# Patient Record
Sex: Female | Born: 1935 | Race: White | Hispanic: No | Marital: Single | Smoking: Never smoker
Health system: Southern US, Community
[De-identification: ages and names within clinical notes are randomized; demographics above are authoritative.]

## PROBLEM LIST (undated history)

## (undated) DIAGNOSIS — H9190 Unspecified hearing loss, unspecified ear: Secondary | ICD-10-CM

## (undated) DIAGNOSIS — E119 Type 2 diabetes mellitus without complications: Secondary | ICD-10-CM

## (undated) DIAGNOSIS — H269 Unspecified cataract: Secondary | ICD-10-CM

## (undated) HISTORY — PX: CATARACT EXTRACTION: SUR2

## (undated) HISTORY — PX: TONSILLECTOMY: SUR1361

## (undated) HISTORY — PX: COCHLEAR IMPLANT: SUR684

---

## 2013-01-23 ENCOUNTER — Emergency Department (HOSPITAL_BASED_OUTPATIENT_CLINIC_OR_DEPARTMENT_OTHER)
Admission: EM | Admit: 2013-01-23 | Discharge: 2013-01-23 | Disposition: A | Discharge status: Home / Self Care | Payer: Medicare Other | Attending: Emergency Medicine | Admitting: Emergency Medicine

## 2013-01-23 ENCOUNTER — Encounter (HOSPITAL_BASED_OUTPATIENT_CLINIC_OR_DEPARTMENT_OTHER): Payer: Self-pay | Admitting: *Deleted

## 2013-01-23 DIAGNOSIS — Z8669 Personal history of other diseases of the nervous system and sense organs: Secondary | ICD-10-CM | POA: Insufficient documentation

## 2013-01-23 DIAGNOSIS — E119 Type 2 diabetes mellitus without complications: Secondary | ICD-10-CM | POA: Insufficient documentation

## 2013-01-23 DIAGNOSIS — Z8639 Personal history of other endocrine, nutritional and metabolic disease: Secondary | ICD-10-CM | POA: Insufficient documentation

## 2013-01-23 DIAGNOSIS — Z862 Personal history of diseases of the blood and blood-forming organs and certain disorders involving the immune mechanism: Secondary | ICD-10-CM | POA: Insufficient documentation

## 2013-01-23 DIAGNOSIS — Y9289 Other specified places as the place of occurrence of the external cause: Secondary | ICD-10-CM | POA: Insufficient documentation

## 2013-01-23 DIAGNOSIS — S90822A Blister (nonthermal), left foot, initial encounter: Secondary | ICD-10-CM

## 2013-01-23 DIAGNOSIS — IMO0002 Reserved for concepts with insufficient information to code with codable children: Secondary | ICD-10-CM | POA: Insufficient documentation

## 2013-01-23 DIAGNOSIS — Y939 Activity, unspecified: Secondary | ICD-10-CM | POA: Insufficient documentation

## 2013-01-23 DIAGNOSIS — Z79899 Other long term (current) drug therapy: Secondary | ICD-10-CM | POA: Insufficient documentation

## 2013-01-23 DIAGNOSIS — Z9849 Cataract extraction status, unspecified eye: Secondary | ICD-10-CM | POA: Insufficient documentation

## 2013-01-23 HISTORY — DX: Unspecified hearing loss, unspecified ear: H91.90

## 2013-01-23 HISTORY — DX: Type 2 diabetes mellitus without complications: E11.9

## 2013-01-23 HISTORY — DX: Hemochromatosis, unspecified: E83.119

## 2013-01-23 HISTORY — DX: Unspecified cataract: H26.9

## 2013-01-23 NOTE — ED Provider Notes (Signed)
History    CSN: [id number] Arrival date & time 01/23/13  1140  First MD Initiated Contact with Patient 01/23/13 1211     Chief Complaint  Patient presents with  . Blister   (Consider location/radiation/quality/duration/timing/severity/associated sxs/prior Treatment) Patient is a 79 y.o. female presenting with foot injury.  Foot Injury Location:  Foot Foot location:  L foot Pain details:    Quality:  Aching   Severity:  Moderate   Onset quality:  Sudden   Timing:  Constant   Progression:  Worsening Chronicity:  New Foreign body present:  No foreign bodies Tetanus status:  Up to date Pt reports she was bitten on left foot by something on Sunday.  Pt reports a big blister came up.  Pt reports getting worse,  Was clear now discolored, red around.  Pt wants blister drined Past Medical History  Diagnosis Date  . Diabetes mellitus without complication   . Hemochromatosis   . Cataract of both eyes   . Deaf    Past Surgical History  Procedure Laterality Date  . Tonsillectomy    . Cataract extraction    . Cochlear implant     No family history on file. History  Substance Use Topics  . Smoking status: Never Smoker   . Smokeless tobacco: Not on file  . Alcohol Use: No   OB History   Grav Para Term Preterm Abortions TAB SAB Ect Mult Living                 Review of Systems  Skin: Positive for wound.  All other systems reviewed and are negative.    Allergies  Review of patient's allergies indicates no known allergies.  Home Medications   Current Outpatient Rx  Name  Route  Sig  Dispense  Refill  . doxycycline (DORYX) 100 MG DR capsule   Oral   Take 100 mg by mouth 2 (two) times daily.         Marland Kitchen METFORMIN HCL PO   Oral   Take by mouth.         . Travoprost (TRAVATAN OP)   Ophthalmic   Apply to eye.          BP 139/84  Pulse 79  Temp(Src) 98.7 F (37.1 C) (Oral)  Resp 20  Wt 103 lb (46.72 kg)  SpO2 100% Physical Exam  Nursing note and  vitals reviewed. Constitutional: She is oriented to person, place, and time. She appears well-developed and well-nourished.  Musculoskeletal: She exhibits tenderness.  6cm blister erythema edges, yellow fluid,    Neurological: She is alert and oriented to person, place, and time. She has normal reflexes.  Skin: Skin is warm.  Psychiatric: She has a normal mood and affect.    ED Course  INCISION AND DRAINAGE Date/Time: 01/23/2013 12:55 PM Performed by: Elson Areas Authorized by: Elson Areas Consent: Verbal consent obtained. Risks and benefits: risks, benefits and alternatives were discussed Consent given by: patient Patient understanding: patient states understanding of the procedure being performed Patient identity confirmed: verbally with patient Type: seroma Body area: lower extremity Location details: left foot Anesthesia: local infiltration Local anesthetic: lidocaine 2% without epinephrine Scalpel size: 11 Drainage: serosanguinous Patient tolerance: Patient tolerated the procedure well with no immediate complications. Comments: Culture obtained,      (including critical care time) Labs Reviewed - No data to display No results found. 1. Blister of left foot, initial encounter     MDM  Pt advised to  follow up for recheck in 2 days, sterile dressing,   Culture obtained,    Elson Areas, PA-C 01/23/13 1302

## 2013-01-23 NOTE — ED Notes (Signed)
States she was at an outdoor concert and she thinks a bug bit her. Left foot is painful. Large water filled blister noted.

## 2013-01-23 NOTE — ED Provider Notes (Signed)
Medical screening examination/treatment/procedure(s) were performed by non-physician practitioner and as supervising physician I was immediately available for consultation/collaboration.   Glynn Octave, MD 01/23/13 [phone]

## 2013-01-26 LAB — WOUND CULTURE: Gram Stain: NONE SEEN

## 2013-12-09 ENCOUNTER — Other Ambulatory Visit (HOSPITAL_COMMUNITY): Payer: Self-pay | Admitting: "Endocrinology

## 2013-12-09 DIAGNOSIS — E0520 Thyrotoxicosis with toxic multinodular goiter without thyrotoxic crisis or storm: Secondary | ICD-10-CM

## 2013-12-22 ENCOUNTER — Ambulatory Visit (HOSPITAL_COMMUNITY)
Admission: RE | Admit: 2013-12-22 | Discharge: 2013-12-22 | Disposition: A | Discharge status: Home / Self Care | Payer: Medicare Other | Source: Ambulatory Visit | Attending: "Endocrinology | Admitting: "Endocrinology

## 2013-12-22 DIAGNOSIS — E042 Nontoxic multinodular goiter: Secondary | ICD-10-CM | POA: Insufficient documentation

## 2013-12-22 DIAGNOSIS — E0520 Thyrotoxicosis with toxic multinodular goiter without thyrotoxic crisis or storm: Secondary | ICD-10-CM

## 2013-12-22 MED ORDER — SODIUM PERTECHNETATE TC 99M INJECTION
10.0000 | Freq: Once | INTRAVENOUS | Status: AC | PRN
  Administered 2013-12-22: 10 via INTRAVENOUS

## 2014-01-02 ENCOUNTER — Other Ambulatory Visit: Payer: Self-pay | Admitting: "Endocrinology

## 2014-01-02 DIAGNOSIS — E041 Nontoxic single thyroid nodule: Secondary | ICD-10-CM

## 2014-01-15 ENCOUNTER — Other Ambulatory Visit (HOSPITAL_COMMUNITY)
Admission: RE | Admit: 2014-01-15 | Discharge: 2014-01-15 | Disposition: A | Discharge status: Home / Self Care | Payer: Medicare Other | Source: Ambulatory Visit | Attending: Interventional Radiology | Admitting: Interventional Radiology

## 2014-01-15 ENCOUNTER — Ambulatory Visit
Admission: RE | Admit: 2014-01-15 | Discharge: 2014-01-15 | Disposition: A | Discharge status: Home / Self Care | Payer: Medicare Other | Source: Ambulatory Visit | Attending: "Endocrinology | Admitting: "Endocrinology

## 2014-01-15 DIAGNOSIS — E0590 Thyrotoxicosis, unspecified without thyrotoxic crisis or storm: Secondary | ICD-10-CM | POA: Insufficient documentation

## 2014-01-15 DIAGNOSIS — E041 Nontoxic single thyroid nodule: Secondary | ICD-10-CM | POA: Diagnosis present

## 2014-01-15 IMAGING — US US THYROID BIOPSY
1 series · 14 of 25 positions shown · non-contrast
Comparison: 07/28/2013

CLINICAL DATA: Dominant right inferior thyroid nodule

EXAM:
ULTRASOUND GUIDED NEEDLE ASPIRATE BIOPSY OF THE THYROID GLAND

[Series 1: us thyroid biopsy · 0.06mm/px · 26 acquisitions, 14 frames shown]
[im 1/26]
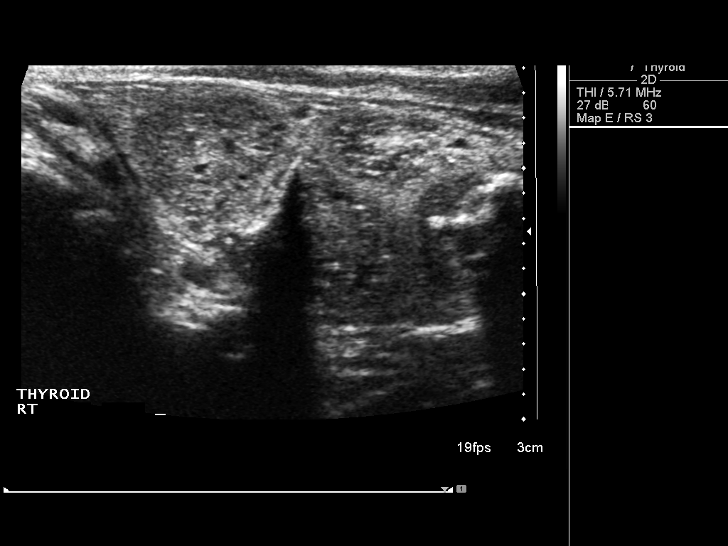
[im 3/26]
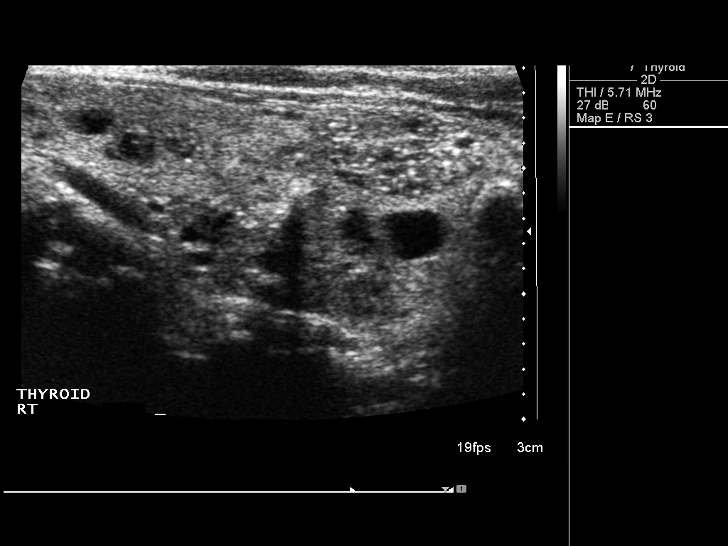
[im 5/26]
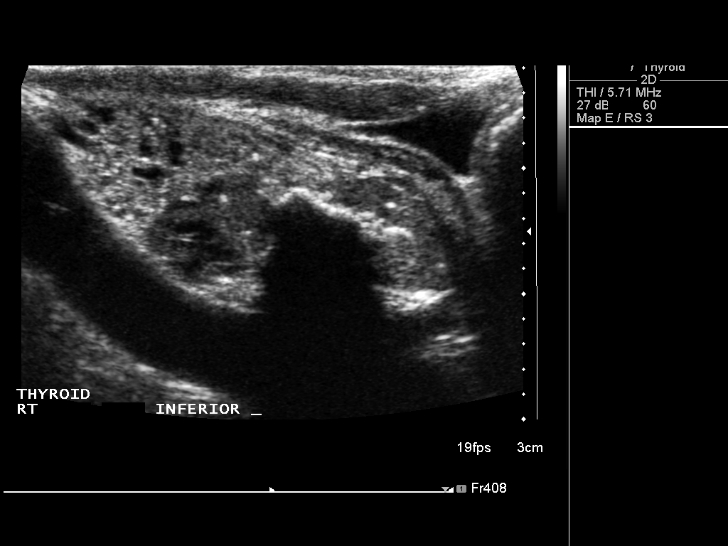
[im 7/26]
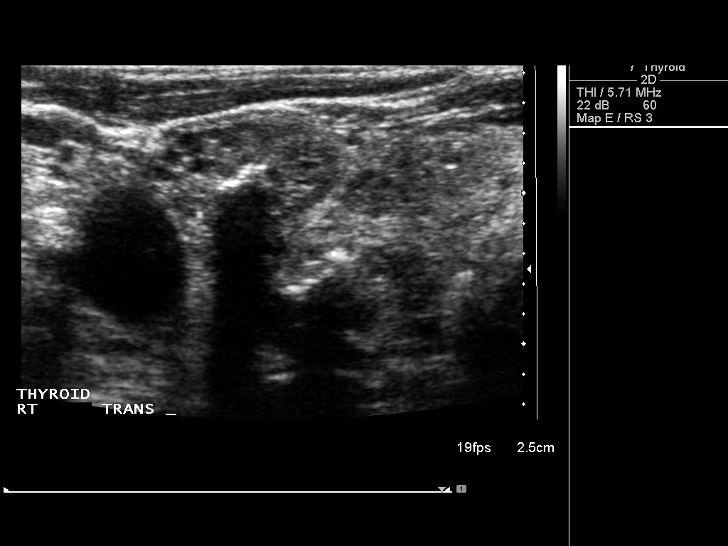
[im 9/26]
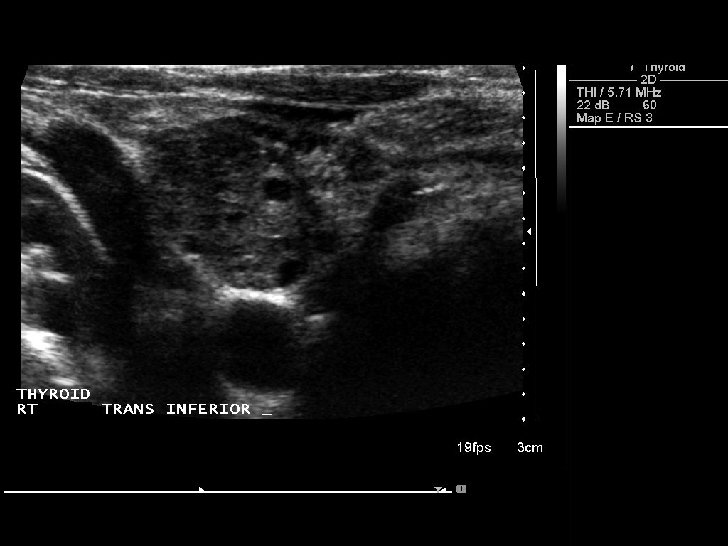
[im 10/26]
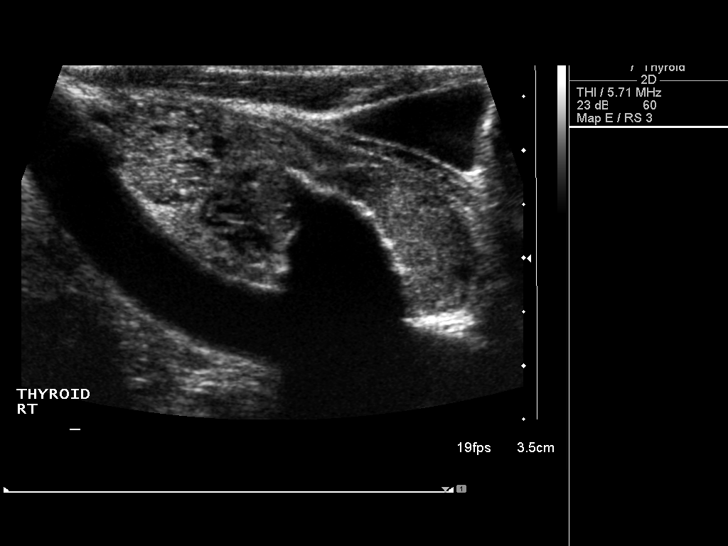
[im 12/26]
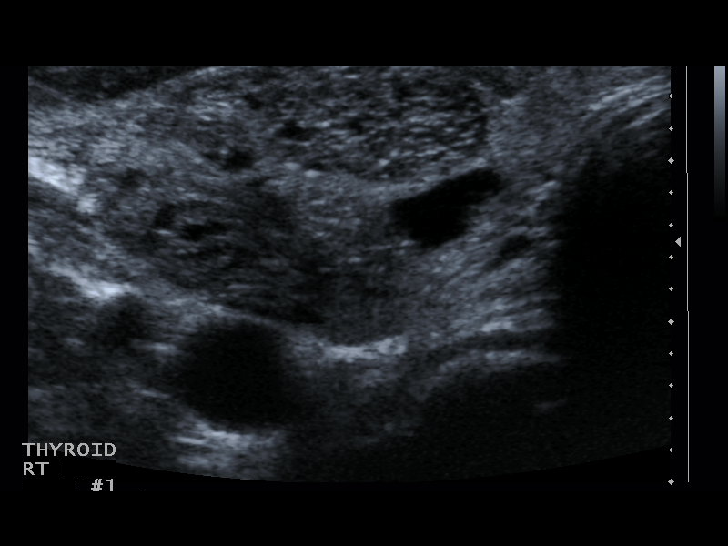
[im 14/26]
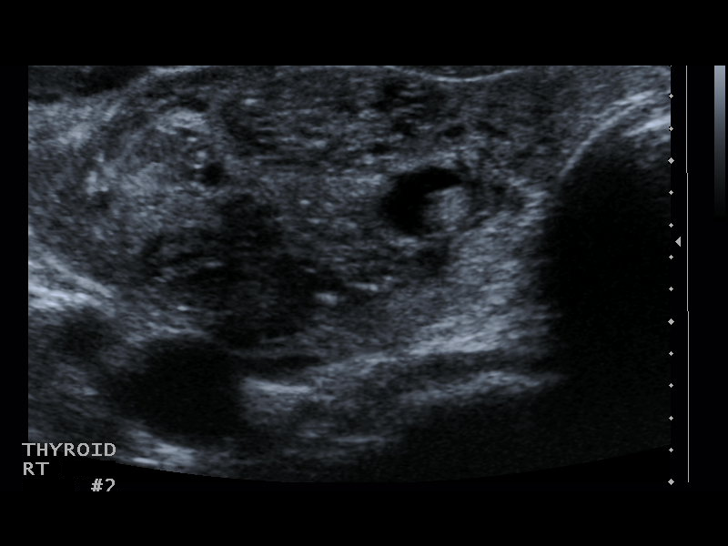
[im 16/26]
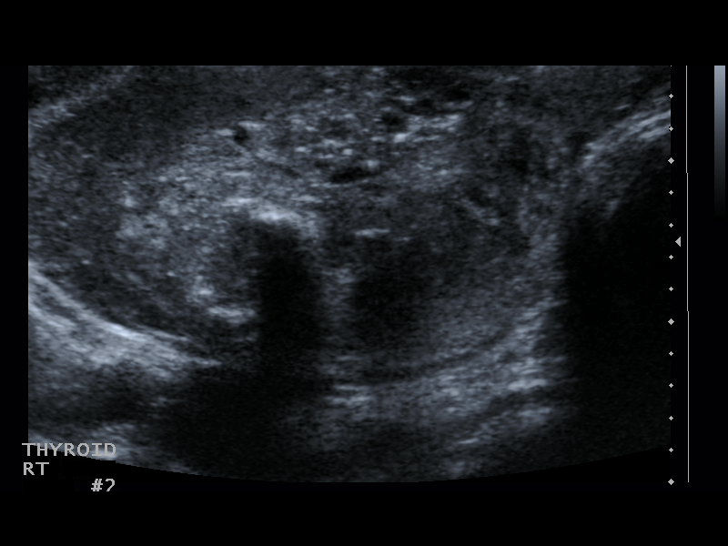
[im 17/26]
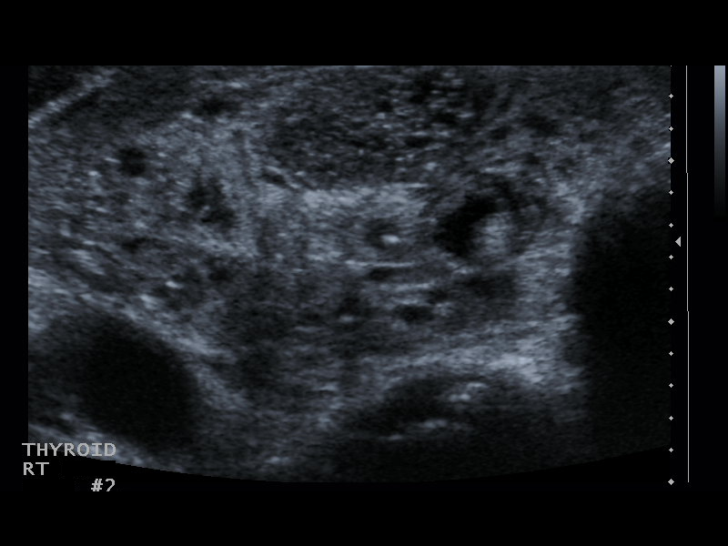
[im 19/26]
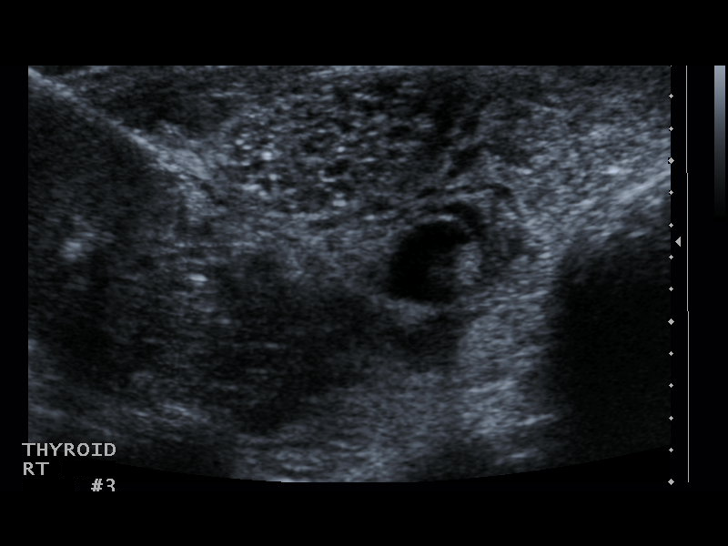
[im 21/26]
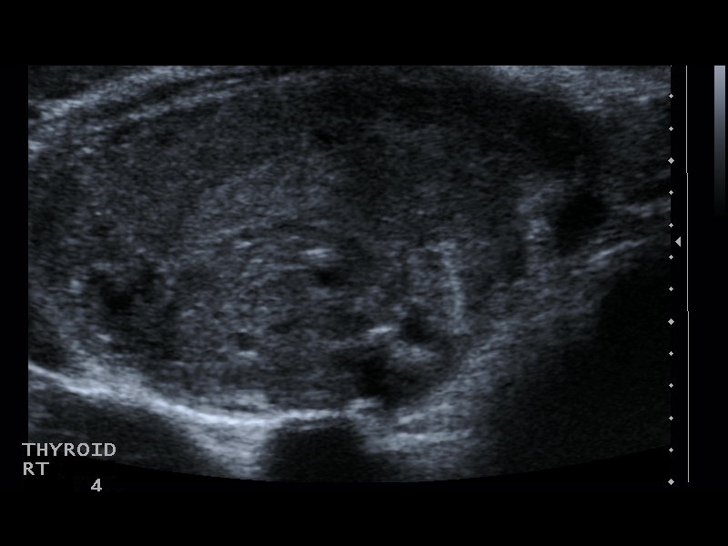
[im 23/26]
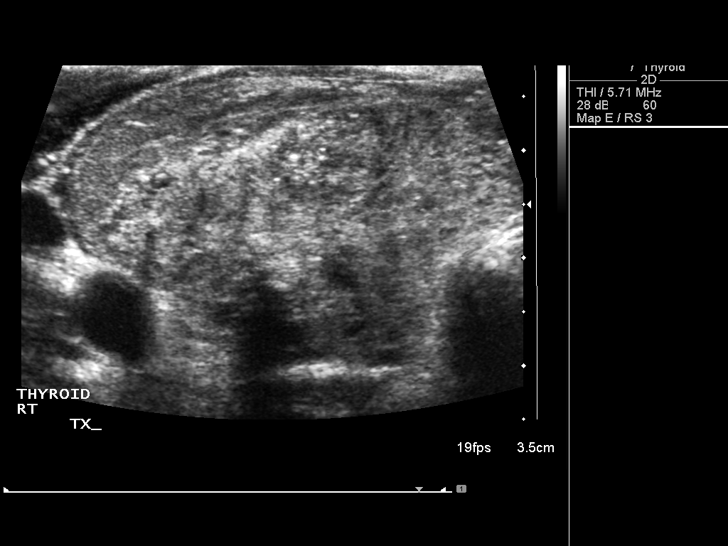
[im 26/26]
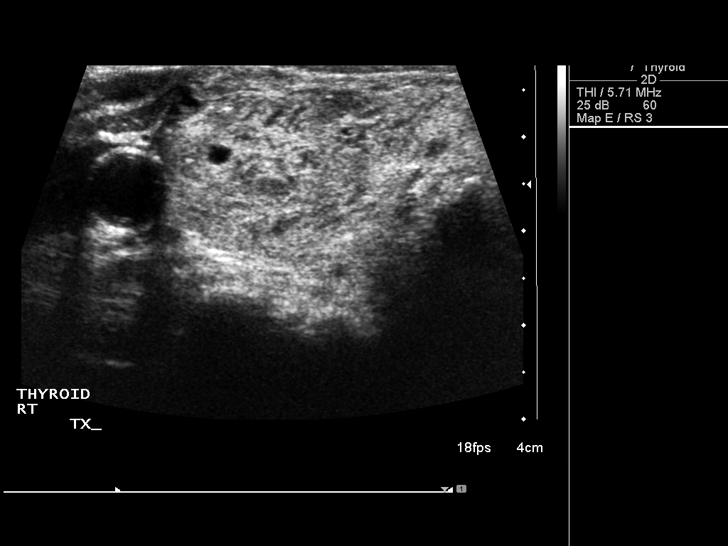

[14 of 25 positions shown; findings below may reference images not displayed]

PROCEDURE:
Thyroid biopsy was thoroughly discussed with the patient and
questions were answered. The benefits, risks, alternatives, and
complications were also discussed. The patient understands and
wishes to proceed with the procedure. Written consent was obtained.

Ultrasound was performed to localize and mark an adequate site for
the biopsy. The patient was then prepped and draped in a normal
sterile fashion. Local anesthesia was provided with 1% lidocaine.
Using direct ultrasound guidance, 4 passes were made using needles
into the nodule within the right lobe of the thyroid. Ultrasound was
used to confirm needle placements on all occasions. Specimens were
sent to Pathology for analysis.

Complications:  No immediate
FINDINGS: Imaging confirms needle placed in the dominant right inferior
thyroid nodule
IMPRESSION: Ultrasound guided needle aspirate biopsy performed of the dominant
right inferior thyroid nodule.

## 2016-05-02 ENCOUNTER — Other Ambulatory Visit (INDEPENDENT_AMBULATORY_CARE_PROVIDER_SITE_OTHER): Payer: Self-pay | Admitting: Orthopedic Surgery

## 2017-11-06 ENCOUNTER — Inpatient Hospital Stay (HOSPITAL_COMMUNITY)
Admission: EM | Admit: 2017-11-06 | Discharge: 2017-12-08 | DRG: 064 | Disposition: E | Payer: Medicare Other | Attending: Pulmonary Disease | Admitting: Pulmonary Disease

## 2017-11-06 ENCOUNTER — Encounter (HOSPITAL_COMMUNITY): Payer: Self-pay

## 2017-11-06 ENCOUNTER — Inpatient Hospital Stay (HOSPITAL_COMMUNITY): Payer: Medicare Other

## 2017-11-06 ENCOUNTER — Emergency Department (HOSPITAL_COMMUNITY): Payer: Medicare Other

## 2017-11-06 DIAGNOSIS — Z515 Encounter for palliative care: Secondary | ICD-10-CM | POA: Diagnosis present

## 2017-11-06 DIAGNOSIS — R4701 Aphasia: Secondary | ICD-10-CM | POA: Diagnosis present

## 2017-11-06 DIAGNOSIS — R402432 Glasgow coma scale score 3-8, at arrival to emergency department: Secondary | ICD-10-CM | POA: Diagnosis present

## 2017-11-06 DIAGNOSIS — Z9621 Cochlear implant status: Secondary | ICD-10-CM | POA: Diagnosis present

## 2017-11-06 DIAGNOSIS — Z7189 Other specified counseling: Secondary | ICD-10-CM | POA: Diagnosis not present

## 2017-11-06 DIAGNOSIS — I610 Nontraumatic intracerebral hemorrhage in hemisphere, subcortical: Secondary | ICD-10-CM | POA: Diagnosis not present

## 2017-11-06 DIAGNOSIS — I10 Essential (primary) hypertension: Secondary | ICD-10-CM | POA: Diagnosis present

## 2017-11-06 DIAGNOSIS — H548 Legal blindness, as defined in USA: Secondary | ICD-10-CM | POA: Diagnosis present

## 2017-11-06 DIAGNOSIS — I616 Nontraumatic intracerebral hemorrhage, multiple localized: Secondary | ICD-10-CM

## 2017-11-06 DIAGNOSIS — S0003XA Contusion of scalp, initial encounter: Secondary | ICD-10-CM | POA: Diagnosis present

## 2017-11-06 DIAGNOSIS — I161 Hypertensive emergency: Secondary | ICD-10-CM | POA: Diagnosis present

## 2017-11-06 DIAGNOSIS — J9601 Acute respiratory failure with hypoxia: Secondary | ICD-10-CM | POA: Diagnosis present

## 2017-11-06 DIAGNOSIS — I680 Cerebral amyloid angiopathy: Secondary | ICD-10-CM | POA: Diagnosis present

## 2017-11-06 DIAGNOSIS — Z681 Body mass index (BMI) 19 or less, adult: Secondary | ICD-10-CM

## 2017-11-06 DIAGNOSIS — Z9089 Acquired absence of other organs: Secondary | ICD-10-CM

## 2017-11-06 DIAGNOSIS — Z7983 Long term (current) use of bisphosphonates: Secondary | ICD-10-CM | POA: Diagnosis not present

## 2017-11-06 DIAGNOSIS — J9600 Acute respiratory failure, unspecified whether with hypoxia or hypercapnia: Secondary | ICD-10-CM

## 2017-11-06 DIAGNOSIS — Z66 Do not resuscitate: Secondary | ICD-10-CM | POA: Diagnosis present

## 2017-11-06 DIAGNOSIS — E785 Hyperlipidemia, unspecified: Secondary | ICD-10-CM | POA: Diagnosis present

## 2017-11-06 DIAGNOSIS — I619 Nontraumatic intracerebral hemorrhage, unspecified: Secondary | ICD-10-CM | POA: Diagnosis present

## 2017-11-06 DIAGNOSIS — H9190 Unspecified hearing loss, unspecified ear: Secondary | ICD-10-CM | POA: Diagnosis present

## 2017-11-06 DIAGNOSIS — Z9849 Cataract extraction status, unspecified eye: Secondary | ICD-10-CM

## 2017-11-06 DIAGNOSIS — R34 Anuria and oliguria: Secondary | ICD-10-CM | POA: Diagnosis not present

## 2017-11-06 DIAGNOSIS — Z7984 Long term (current) use of oral hypoglycemic drugs: Secondary | ICD-10-CM

## 2017-11-06 DIAGNOSIS — I611 Nontraumatic intracerebral hemorrhage in hemisphere, cortical: Secondary | ICD-10-CM | POA: Diagnosis present

## 2017-11-06 DIAGNOSIS — E43 Unspecified severe protein-calorie malnutrition: Secondary | ICD-10-CM | POA: Diagnosis present

## 2017-11-06 DIAGNOSIS — E854 Organ-limited amyloidosis: Secondary | ICD-10-CM | POA: Diagnosis present

## 2017-11-06 DIAGNOSIS — G9349 Other encephalopathy: Secondary | ICD-10-CM | POA: Diagnosis present

## 2017-11-06 DIAGNOSIS — E119 Type 2 diabetes mellitus without complications: Secondary | ICD-10-CM | POA: Diagnosis present

## 2017-11-06 DIAGNOSIS — J9690 Respiratory failure, unspecified, unspecified whether with hypoxia or hypercapnia: Secondary | ICD-10-CM

## 2017-11-06 DIAGNOSIS — E876 Hypokalemia: Secondary | ICD-10-CM | POA: Diagnosis present

## 2017-11-06 DIAGNOSIS — R0689 Other abnormalities of breathing: Secondary | ICD-10-CM

## 2017-11-06 DIAGNOSIS — I629 Nontraumatic intracranial hemorrhage, unspecified: Secondary | ICD-10-CM

## 2017-11-06 DIAGNOSIS — R4020 Unspecified coma: Secondary | ICD-10-CM | POA: Diagnosis present

## 2017-11-06 LAB — I-STAT ARTERIAL BLOOD GAS, ED
Acid-base deficit: 2 mmol/L (ref 0.0–2.0)
Bicarbonate: 22.7 mmol/L (ref 20.0–28.0)
O2 Saturation: 100 %
Patient temperature: 38.2
TCO2: 24 mmol/L (ref 22–32)
pCO2 arterial: 39.7 mmHg (ref 32.0–48.0)
pH, Arterial: 7.371 (ref 7.350–7.450)
pO2, Arterial: 410 mmHg — ABNORMAL HIGH (ref 83.0–108.0)

## 2017-11-06 LAB — URINALYSIS, ROUTINE W REFLEX MICROSCOPIC
Bacteria, UA: NONE SEEN
Bilirubin Urine: NEGATIVE
Glucose, UA: >=500 mg/dL — AB
Ketones, ur: 20 mg/dL — AB
Leukocytes, UA: NEGATIVE
Nitrite: NEGATIVE
Protein, ur: 100 mg/dL — AB
Specific Gravity, Urine: 1.011 (ref 1.005–1.030)
pH: 7 (ref 5.0–8.0)

## 2017-11-06 LAB — CBC WITH DIFFERENTIAL/PLATELET
Basophils Absolute: 0 10*3/uL (ref 0.0–0.1)
Basophils Relative: 0 %
Eosinophils Absolute: 0 10*3/uL (ref 0.0–0.7)
Eosinophils Relative: 0 %
HCT: 40.4 % (ref 36.0–46.0)
Hemoglobin: 13.6 g/dL (ref 12.0–15.0)
Lymphocytes Relative: 4 %
Lymphs Abs: 0.8 10*3/uL (ref 0.7–4.0)
MCH: 29.7 pg (ref 26.0–34.0)
MCHC: 33.7 g/dL (ref 30.0–36.0)
MCV: 88.2 fL (ref 78.0–100.0)
Monocytes Absolute: 1.2 10*3/uL — ABNORMAL HIGH (ref 0.1–1.0)
Monocytes Relative: 7 %
Neutro Abs: 15.7 10*3/uL — ABNORMAL HIGH (ref 1.7–7.7)
Neutrophils Relative %: 89 %
Platelets: 222 10*3/uL (ref 150–400)
RBC: 4.58 MIL/uL (ref 3.87–5.11)
RDW: 14.1 % (ref 11.5–15.5)
WBC: 17.7 10*3/uL — ABNORMAL HIGH (ref 4.0–10.5)

## 2017-11-06 LAB — AMMONIA: Ammonia: 45 umol/L — ABNORMAL HIGH (ref 9–35)

## 2017-11-06 LAB — COMPREHENSIVE METABOLIC PANEL
ALT: 23 U/L (ref 14–54)
AST: 58 U/L — ABNORMAL HIGH (ref 15–41)
Albumin: 3.7 g/dL (ref 3.5–5.0)
Alkaline Phosphatase: 37 U/L — ABNORMAL LOW (ref 38–126)
Anion gap: 14 (ref 5–15)
BUN: 16 mg/dL (ref 6–20)
CO2: 22 mmol/L (ref 22–32)
Calcium: 8.8 mg/dL — ABNORMAL LOW (ref 8.9–10.3)
Chloride: 100 mmol/L — ABNORMAL LOW (ref 101–111)
Creatinine, Ser: 0.79 mg/dL (ref 0.44–1.00)
GFR calc Af Amer: >60 mL/min (ref >60)
GFR calc non Af Amer: >60 mL/min (ref >60)
Glucose, Bld: 345 mg/dL — ABNORMAL HIGH (ref 65–99)
Potassium: 3.5 mmol/L (ref 3.5–5.1)
Sodium: 136 mmol/L (ref 135–145)
Total Bilirubin: 1.1 mg/dL (ref 0.3–1.2)
Total Protein: 6.1 g/dL — ABNORMAL LOW (ref 6.5–8.1)

## 2017-11-06 LAB — PROTIME-INR
INR: 1.24
Prothrombin Time: 15.5 seconds — ABNORMAL HIGH (ref 11.4–15.2)

## 2017-11-06 LAB — LACTIC ACID, PLASMA
Lactic Acid, Venous: 4.5 mmol/L (ref 0.5–1.9)
Lactic Acid, Venous: 2.4 mmol/L (ref 0.5–1.9)

## 2017-11-06 LAB — TROPONIN I: Troponin I: 0.68 ng/mL (ref <0.03)

## 2017-11-06 LAB — MAGNESIUM: Magnesium: 1.8 mg/dL (ref 1.7–2.4)

## 2017-11-06 LAB — CBG MONITORING, ED: GLUCOSE-CAPILLARY: 261 mg/dL — AB (ref 65–99)

## 2017-11-06 IMAGING — CT CT ANGIO NECK
2 of 8 series · 8 of 35 positions shown · IV contrast (OMNI 350)
Comparison: CT HEAD November 06, 2017

CLINICAL DATA: Follow-up intracranial hemorrhage. History of
diabetes.

EXAM:
CT ANGIOGRAPHY HEAD AND NECK
TECHNIQUE: Multidetector CT imaging of the head and neck was performed using
the standard protocol during bolus administration of intravenous
contrast. Multiplanar CT image reconstructions and MIPs were
obtained to evaluate the vascular anatomy. Carotid stenosis
measurements (when applicable) are obtained utilizing NASCET
criteria, using the distal internal carotid diameter as the
denominator.
CONTRAST:  50mL [id number] IOPAMIDOL (YTV4WV-9V0) INJECTION 76%

[Series 7: cta neck axial · axial · 0.36mm/px · z∈[-333,-105]mm · 6 of 321 slices shown]
[im 46/321  soft-tissue]
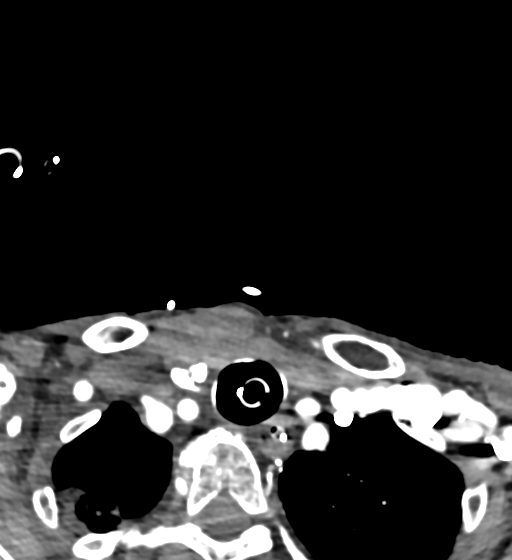
[im 92/321  bone]
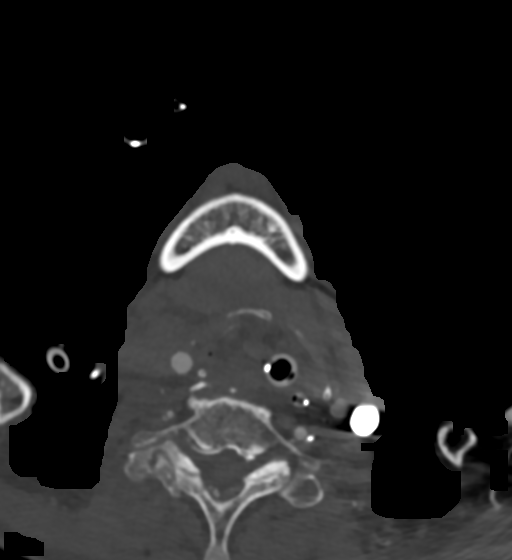
[im 138/321  soft-tissue]
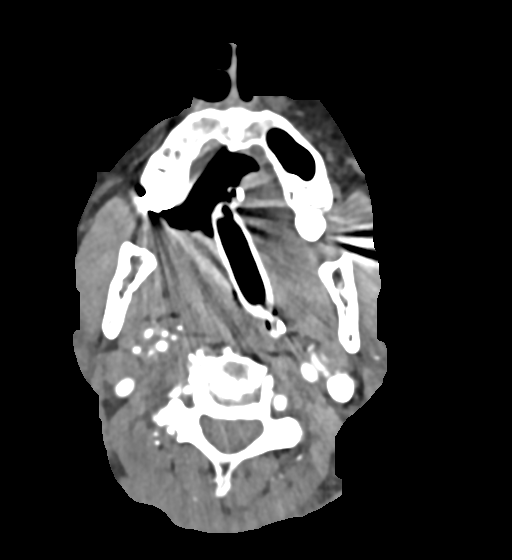
[im 183/321  bone]
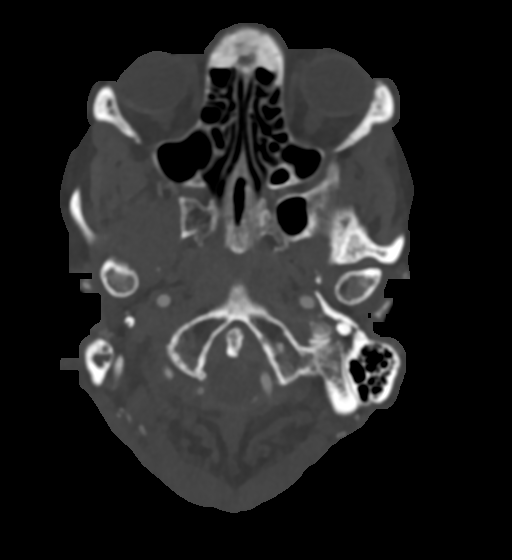
[im 229/321  soft-tissue]
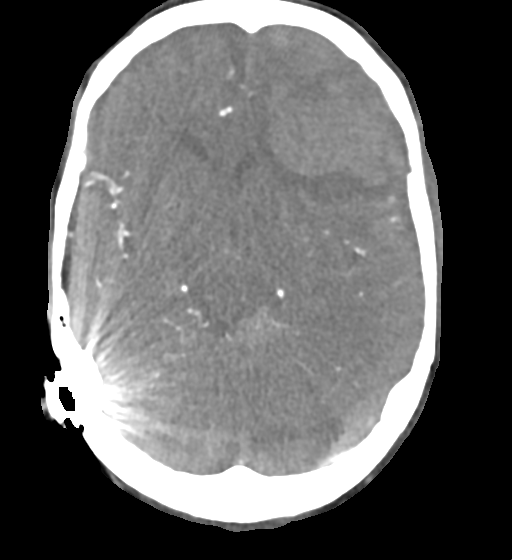
[im 275/321  bone]
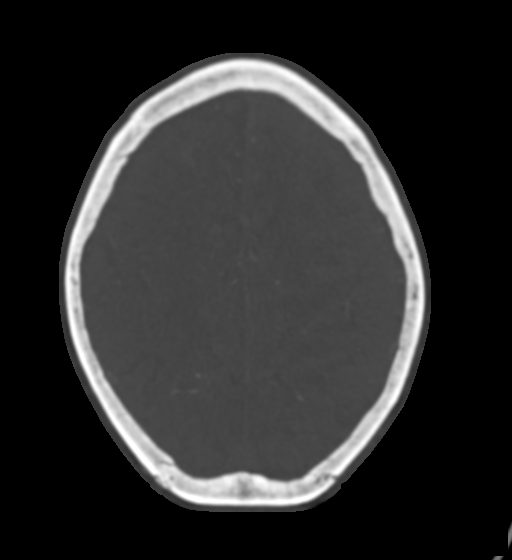

[Series 9: cta neck sagittal · sagittal · 0.42mm/px · 2 of 155 slices shown]
[im 32/155  soft-tissue]
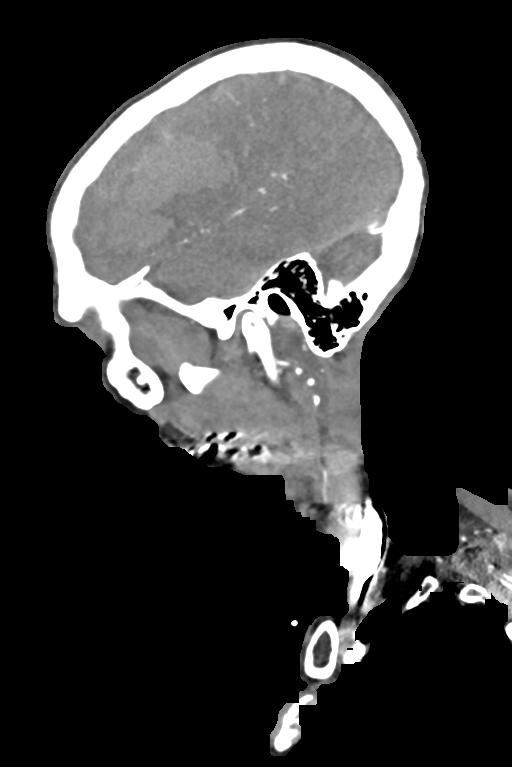
[im 124/155  soft-tissue]
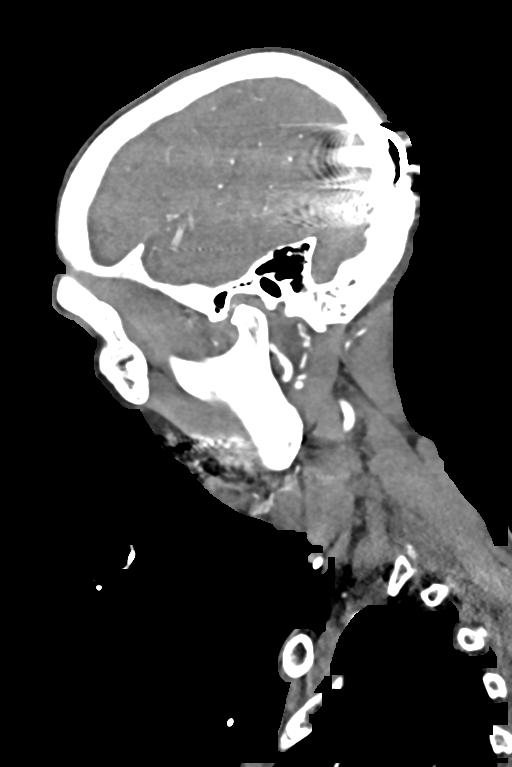

[8 of 35 positions shown; findings below may reference images not displayed]

FINDINGS: CTA NECK FINDINGS:

AORTIC ARCH: Normal appearance of the thoracic arch, normal branch
pattern. Mild intimal thickening calcific atherosclerosis the
origins of the innominate, left Common carotid artery and subclavian
artery are widely patent.

RIGHT CAROTID SYSTEM: Common carotid artery is patent. Normal
appearance of the carotid bifurcation without hemodynamically
significant stenosis by NASCET criteria. Patent internal carotid
artery with mild luminal irregularity most compatible with
atherosclerosis.

LEFT CAROTID SYSTEM: Common carotid artery is patent though
partially obscured by venous contamination streak artifact. Normal
appearance of the carotid bifurcation without hemodynamically
significant stenosis by NASCET criteria. Patent internal carotid
artery with mild luminal irregularity most compatible with
atherosclerosis.

VERTEBRAL ARTERIES:Left vertebral artery is dominant. Mild luminal
irregularity of the vertebral arteries compatible with
atherosclerosis. No dissection.

SKELETON: No acute osseous process though bone windows have not been
submitted. Multiple loose bodies RIGHT shoulder. Status post LEFT
shoulder arthroplasty. Severe degenerative change of the cervical
spine.

OTHER NECK: Soft tissues of the neck are nonacute though, not
tailored for evaluation. Multiple calcified thyroid nodules.

UPPER CHEST: Biapical pleuroparenchymal scarring. Life-support lines
in place.

CTA HEAD FINDINGS:

ANTERIOR CIRCULATION: Patent cervical internal carotid arteries,
petrous, cavernous and supra clinoid internal carotid arteries,
dolichoectasia seen with chronic hypertension. 3 mm wide necked
RIGHT carotid terminus aneurysm. Patent anterior communicating
artery. Patent anterior and middle cerebral arteries.

No large vessel occlusion, significant stenosis, contrast
extravasation or aneurysm.

POSTERIOR CIRCULATION: Patent vertebral arteries, vertebrobasilar
junction and basilar artery, as well as main branch vessels.
Dolichoectatic posterior circulation seen with chronic hypertension.
Patent posterior cerebral arteries.

No large vessel occlusion, significant stenosis, contrast
extravasation or aneurysm.

VENOUS SINUSES: Major dural venous sinuses are patent though not
tailored for evaluation on this angiographic examination.

ANATOMIC VARIANTS: None.

DELAYED PHASE: No abnormal intracranial enhancement. Multiple scalp
hematomas.

MIP images reviewed.
IMPRESSION: CTA NECK:

1. No hemodynamically significant stenosis or acute vascular process
in the neck.

CTA HEAD:

1. No emergent large vessel occlusion or flow limiting stenosis.
2. 3 mm intact RIGHT carotid terminus aneurysm.
3. Multiple scalp hematomas.

## 2017-11-06 IMAGING — DX DG CHEST 1V PORT
1 series · 1 of 1 positions shown · non-contrast
Comparison: 07/09/2015.

CLINICAL DATA: Intubated.

EXAM:
PORTABLE CHEST 1 VIEW

[chest]
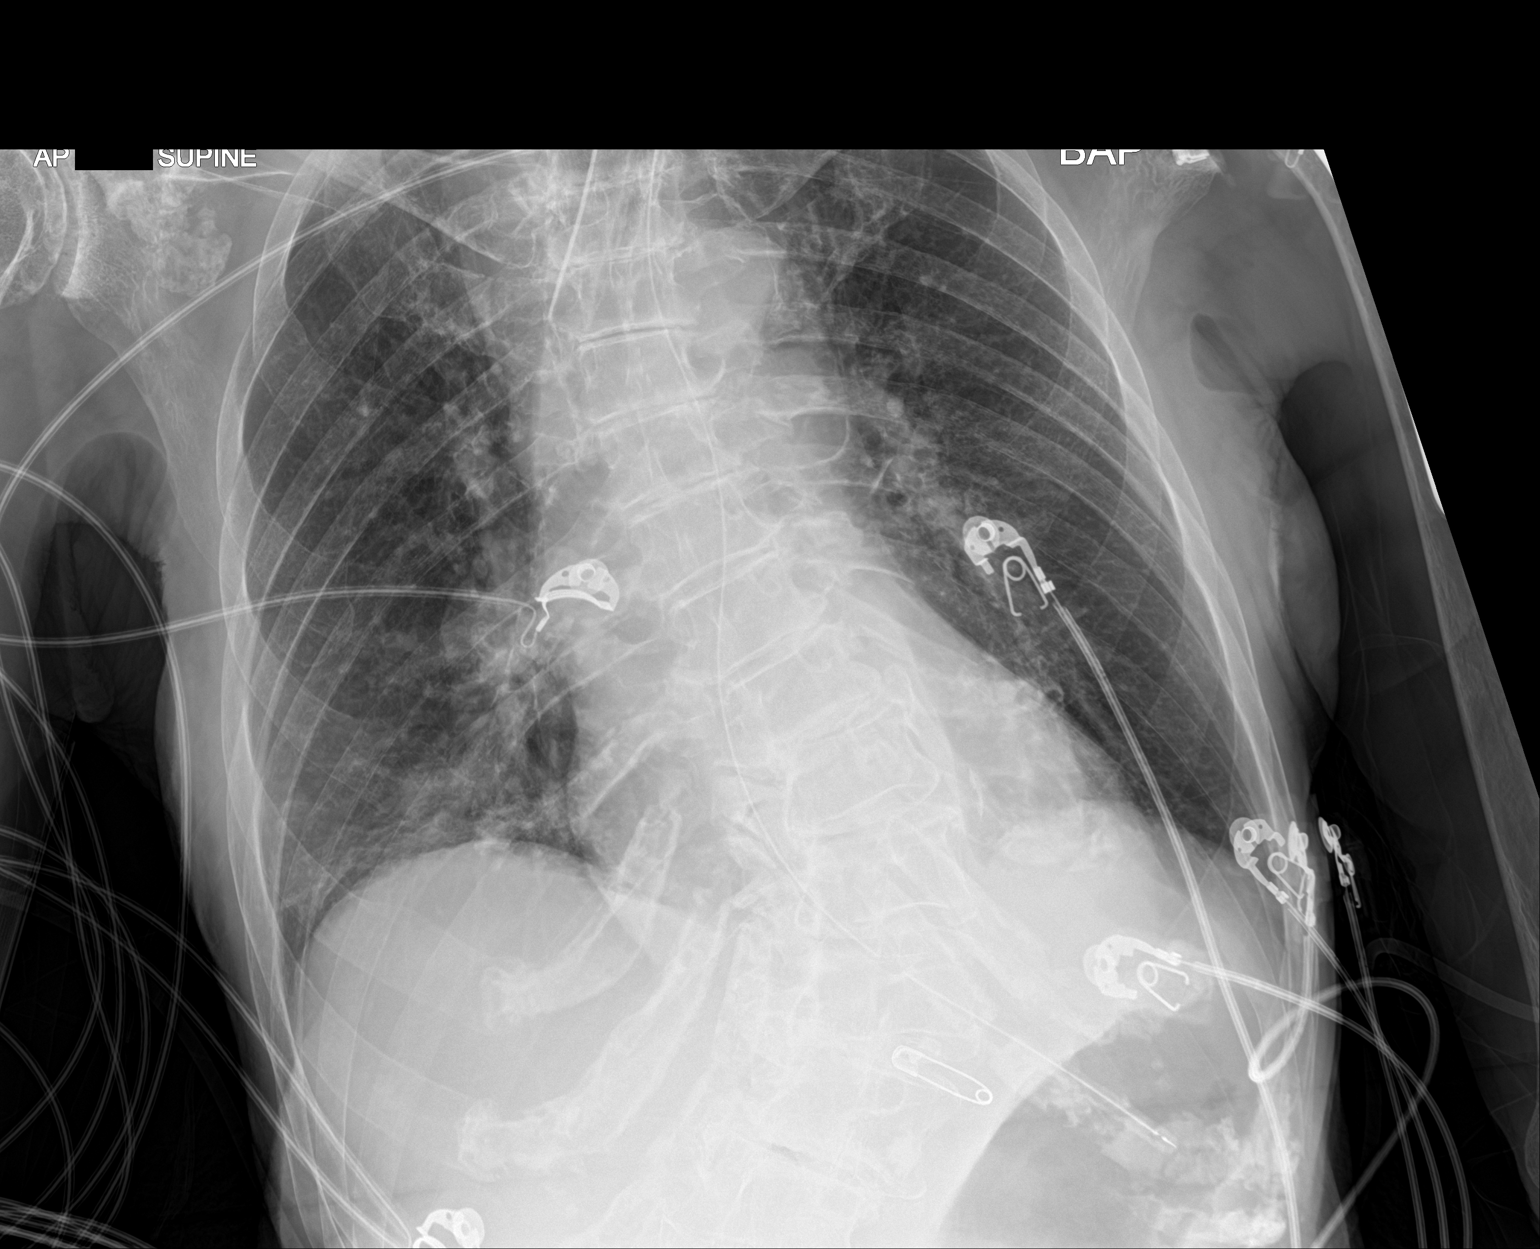

[1 of 1 positions shown; findings below may reference images not displayed]

FINDINGS: Normal sized heart. Interval endotracheal tube in satisfactory
position. Interval nasogastric tube with its tip in the proximal
stomach and side hole at the gastroesophageal junction. Decreased
inspiration with mild right basilar atelectasis. The interstitial
markings remain minimally prominent. Right shoulder degenerative
changes and bursal calcifications. Interval left shoulder
prosthesis. Marked thoracolumbar scoliosis without significant
change.
IMPRESSION: 1. Endotracheal tube in satisfactory position and nasogastric tube
tip at the gastroesophageal junction.
2. Decreased inspiration with mild right basilar atelectasis and
possible pneumonia.
3. Stable minimal chronic interstitial lung disease.

## 2017-11-06 IMAGING — CT CT HEAD W/O CM
4 series · 15 of 47 positions shown, 17 images · non-contrast
Comparison: None.

ADDENDUM:
Scalp soft tissue thickening at the vertex of the skull extending to
the right lateral frontal region compatible with contusion.

By: Popo Mueller M.D.
CLINICAL DATA: 85 y/o  F; found unresponsive.
EXAM:
CT HEAD WITHOUT CONTRAST
TECHNIQUE: Contiguous axial images were obtained from the base of the skull
through the vertex without intravenous contrast.

[Series 3: head without · axial · non-contrast · 0.44mm/px · z∈[-214,-99]mm · 7 of 31 slices shown, 9 images]
[im 4/31  brain]
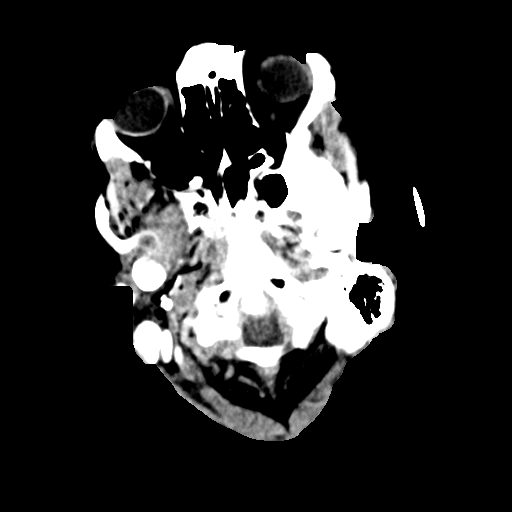
[im 4/31  bone]
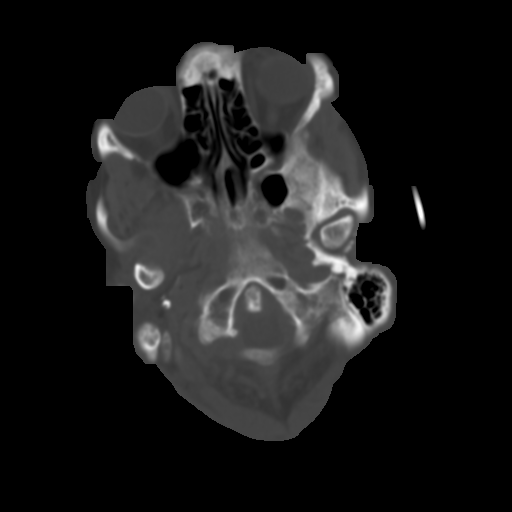
[im 8/31  brain]
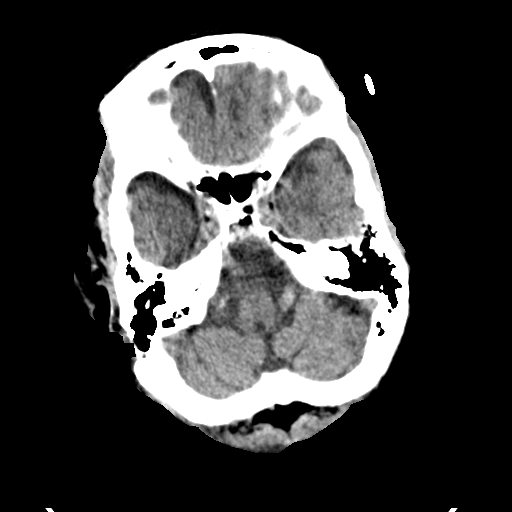
[im 12/31  brain]
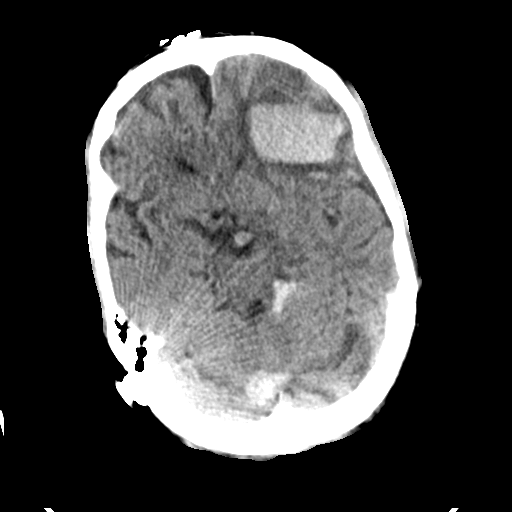
[im 16/31  brain]
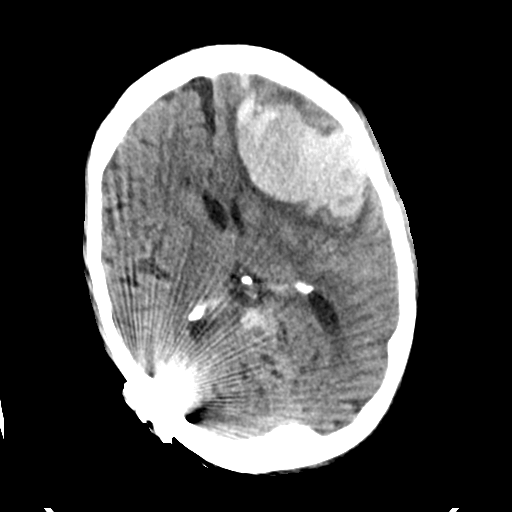
[im 19/31  brain]
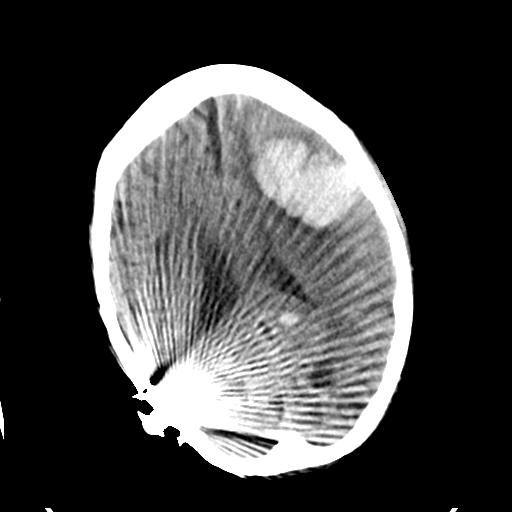
[im 19/31  bone]
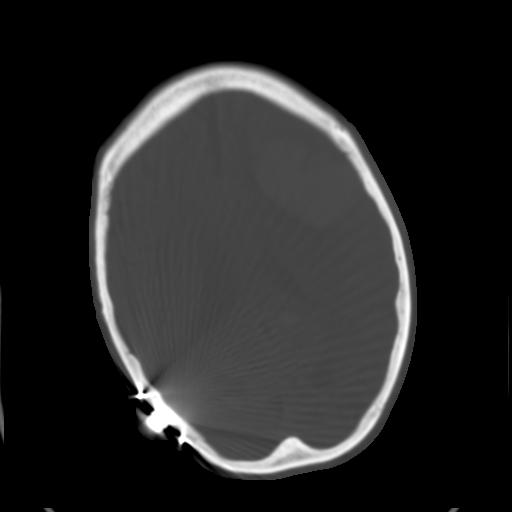
[im 23/31  brain]
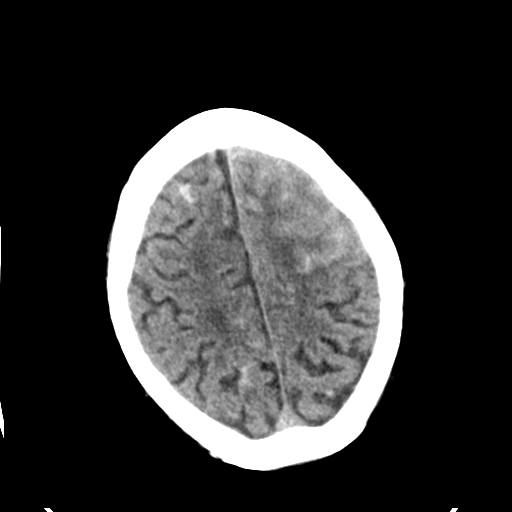
[im 27/31  brain]
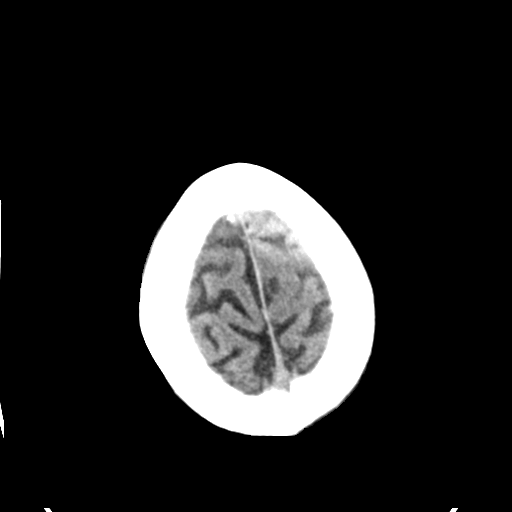

[Series 4: head bone · axial · 0.44mm/px · z∈[-215,-199]mm · 2 of 76 slices shown]
[im 8/76  bone]
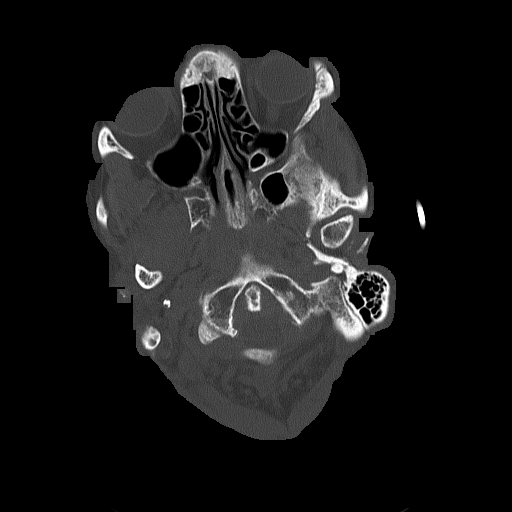
[im 16/76  bone]
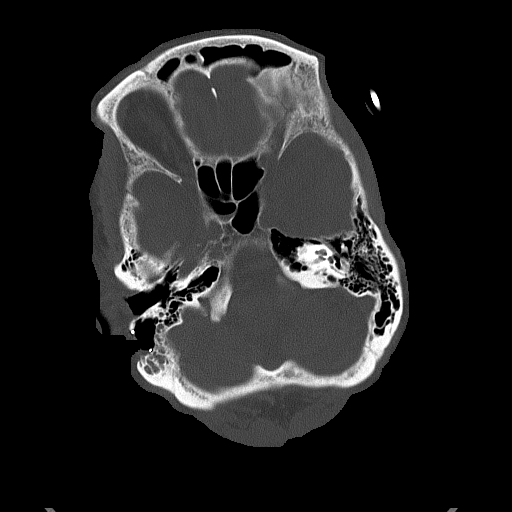

[Series 5: head without cor · coronal · non-contrast · 0.30mm/px · 3 of 67 slices shown]
[im 23/67  brain]
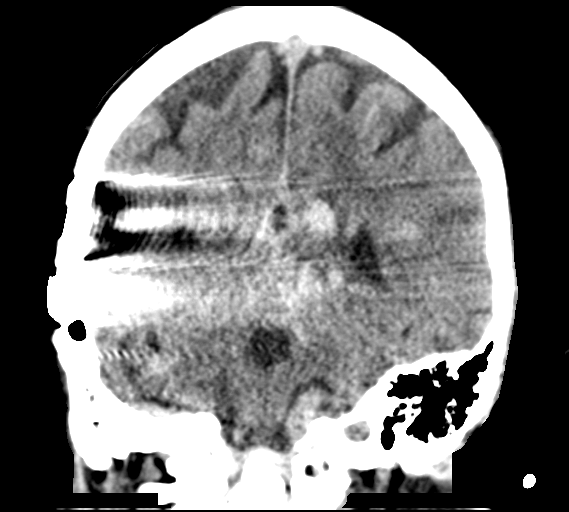
[im 30/67  brain]
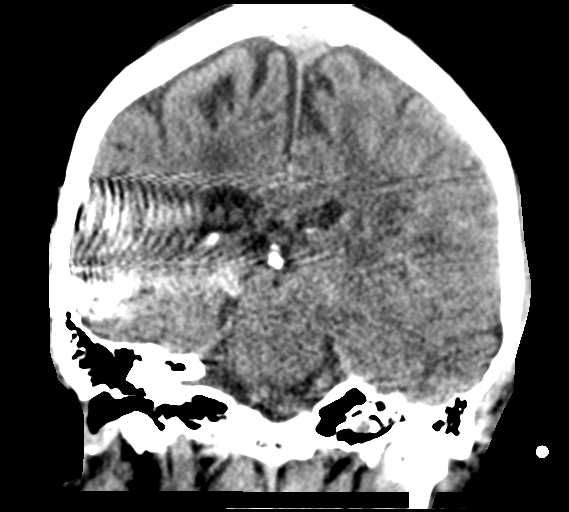
[im 37/67  brain]
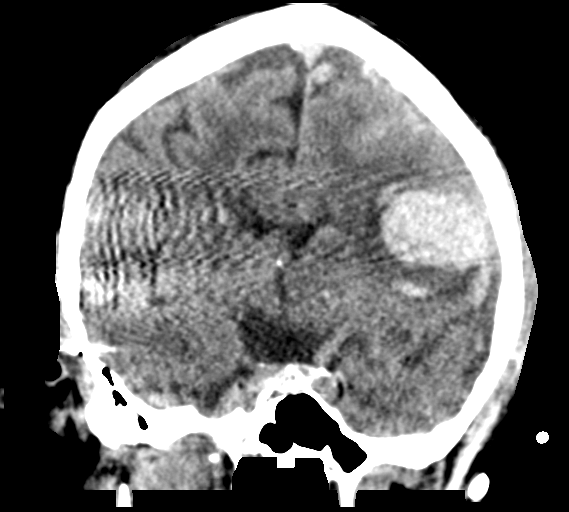

[Series 6: head without sag · sagittal · non-contrast · 0.30mm/px · 3 of 51 slices shown]
[im 17/51  brain]
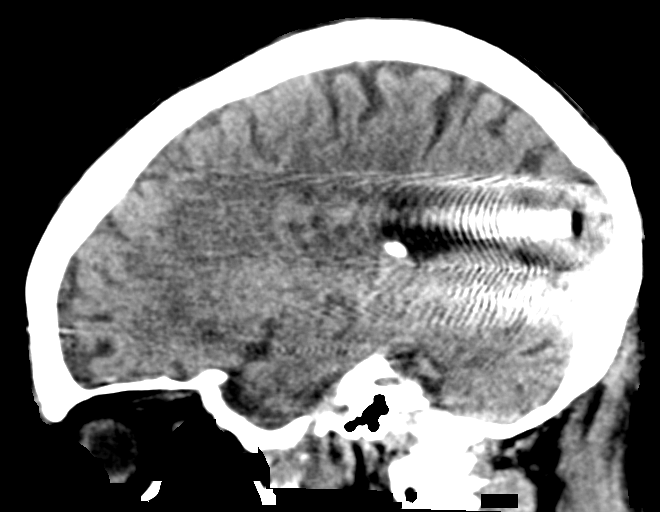
[im 26/51  brain]
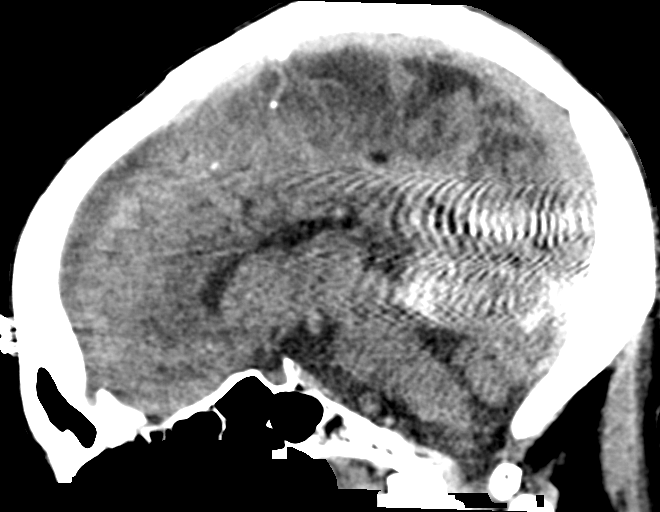
[im 34/51  brain]
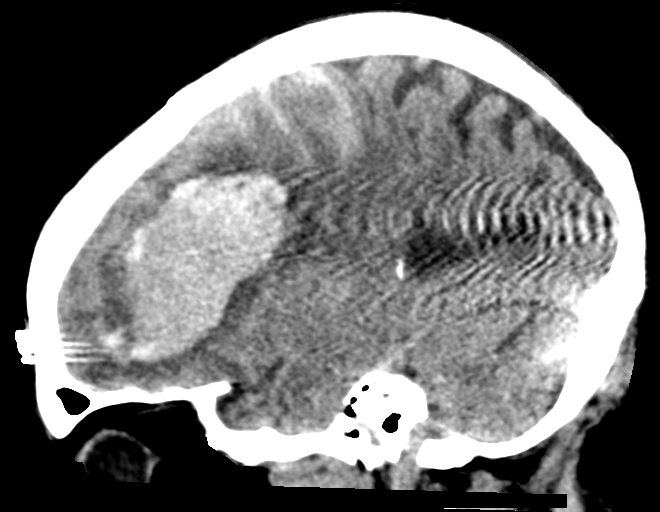

[15 of 47 positions shown; findings below may reference images not displayed]

FINDINGS: Brain: There multiple small foci of acute brain parenchymal
hemorrhage within the dorsal vermis, left superior cerebellar
hemisphere, and left superior frontal lobe without significant mass
effect. There is a large parenchymal hemorrhage within the left
frontal lobe measuring 8.2 x 4.7 x 5.4 cm (volume = 110 cm^3)
series 3, image 17 and series 5, image 22. Mass effect associated
with the large left frontal hematoma results in 5 mm of
left-to-right midline shift. Additionally, there is a small volume
of subarachnoid hemorrhage over the left frontal convexity no
additional areas of subarachnoid hemorrhage over the right frontal
and parietal lobes as well as over the cerebellum. Streak artifact
from the cochlear implant partially obscures the midportion of the
brain.

Vascular: Calcific atherosclerosis of carotid siphons.

Skull: Normal. Negative for fracture or focal lesion.

Sinuses/Orbits: Right wall up mastoidectomy with small amount of
fluid in the mastoid ectomy pole and in the right mastoid tip.
Cochlear implant on the right in situ. Normal aeration of paranasal
sinuses. Small right sphenoid sinus fluid level.

Other: None.
IMPRESSION: 1. Large left frontal lobe hemorrhage measuring up to 8.2 cm, 110
cc. Associated edema and mass effect with 5 mm left-to-right midline
shift.
2. Multiple additional small foci of brain parenchymal hemorrhage
within dorsal vermis, left superior cerebellum, and left superior
frontal lobe without significant mass effect.
3. Small volume subarachnoid hemorrhage predominantly over left
frontal convexity with additional areas over the right frontal and
parietal lobes and cerebellum.

Critical Value/emergent results were called by telephone at the time
of interpretation on 11/06/2017 at [DATE] to Dr. INGUNN HARPA RONLOR , who
verbally acknowledged these results.

By: Popo Mueller M.D.

## 2017-11-06 MED ORDER — SODIUM CHLORIDE 0.9 % IV SOLN
INTRAVENOUS | Status: DC
  Administered 2017-11-06: 20:00:00 via INTRAVENOUS

## 2017-11-06 MED ORDER — PROPOFOL 1000 MG/100ML IV EMUL
5.0000 ug/kg/min | Freq: Once | INTRAVENOUS | Status: AC
  Administered 2017-11-06: 20 ug/kg/min via INTRAVENOUS
  Filled 2017-11-06 (×2): qty 100

## 2017-11-06 MED ORDER — NICARDIPINE HCL IN NACL 20-0.86 MG/200ML-% IV SOLN: 3.0000 mg/h | INTRAVENOUS | Status: DC

## 2017-11-06 MED ORDER — LABETALOL HCL 5 MG/ML IV SOLN: 10.0000 mg | Freq: Once | INTRAVENOUS | Status: DC

## 2017-11-06 MED ORDER — ACETAMINOPHEN 325 MG PO TABS
650.0000 mg | ORAL_TABLET | ORAL | Status: DC | PRN
  Administered 2017-11-08: 650 mg via ORAL
  Filled 2017-11-06: qty 2

## 2017-11-06 MED ORDER — DEXMEDETOMIDINE HCL IN NACL 200 MCG/50ML IV SOLN
0.0000 ug/kg/h | INTRAVENOUS | Status: DC
  Filled 2017-11-06: qty 50

## 2017-11-06 MED ORDER — FAMOTIDINE IN NACL 20-0.9 MG/50ML-% IV SOLN
20.0000 mg | Freq: Two times a day (BID) | INTRAVENOUS | Status: DC
Start: 2017-11-06 — End: 2017-11-09
  Administered 2017-11-06 – 2017-11-09 (×6): 20 mg via INTRAVENOUS
  Filled 2017-11-06 (×6): qty 50

## 2017-11-06 MED ORDER — STROKE: EARLY STAGES OF RECOVERY BOOK
Freq: Once | Status: DC
  Filled 2017-11-06: qty 1

## 2017-11-06 MED ORDER — ACETAMINOPHEN 650 MG RE SUPP
650.0000 mg | RECTAL | Status: DC | PRN
  Administered 2017-11-07: 650 mg via RECTAL
  Filled 2017-11-06: qty 1

## 2017-11-06 MED ORDER — CLEVIDIPINE BUTYRATE 0.5 MG/ML IV EMUL: 0.0000 mg/h | INTRAVENOUS | Status: DC

## 2017-11-06 MED ORDER — LOSARTAN POTASSIUM 50 MG PO TABS
50.0000 mg | ORAL_TABLET | Freq: Every day | ORAL | Status: DC
  Administered 2017-11-07 – 2017-11-09 (×3): 50 mg via ORAL
  Filled 2017-11-06 (×3): qty 1

## 2017-11-06 MED ORDER — VANCOMYCIN HCL IN DEXTROSE 1-5 GM/200ML-% IV SOLN
1000.0000 mg | Freq: Once | INTRAVENOUS | Status: AC
  Administered 2017-11-06: 1000 mg via INTRAVENOUS
  Filled 2017-11-06: qty 200

## 2017-11-06 MED ORDER — SODIUM CHLORIDE 0.9 % IV SOLN: 250.0000 mL | INTRAVENOUS | Status: DC | PRN

## 2017-11-06 MED ORDER — LABETALOL HCL 5 MG/ML IV SOLN
10.0000 mg | Freq: Once | INTRAVENOUS | Status: DC
  Filled 2017-11-06: qty 4

## 2017-11-06 MED ORDER — ACETAMINOPHEN 160 MG/5ML PO SOLN
650.0000 mg | ORAL | Status: DC | PRN
  Administered 2017-11-07 – 2017-11-09 (×3): 650 mg
  Filled 2017-11-06 (×3): qty 20.3

## 2017-11-06 MED ORDER — ACETAMINOPHEN 650 MG RE SUPP
650.0000 mg | Freq: Once | RECTAL | Status: AC
  Administered 2017-11-06: 650 mg via RECTAL
  Filled 2017-11-06: qty 1

## 2017-11-06 MED ORDER — SODIUM CHLORIDE 0.9 % IV SOLN
1.0000 g | Freq: Once | INTRAVENOUS | Status: AC
  Administered 2017-11-06: 1 g via INTRAVENOUS
  Filled 2017-11-06 (×2): qty 1

## 2017-11-06 MED ORDER — SENNOSIDES-DOCUSATE SODIUM 8.6-50 MG PO TABS
1.0000 | ORAL_TABLET | Freq: Two times a day (BID) | ORAL | Status: DC
  Administered 2017-11-07 – 2017-11-09 (×4): 1 via ORAL
  Filled 2017-11-06 (×4): qty 1

## 2017-11-06 MED ORDER — ETOMIDATE 2 MG/ML IV SOLN
INTRAVENOUS | Status: AC | PRN
  Administered 2017-11-06: 15 mg via INTRAVENOUS

## 2017-11-06 MED ORDER — LABETALOL HCL 5 MG/ML IV SOLN
10.0000 mg | Freq: Once | INTRAVENOUS | Status: AC
  Administered 2017-11-06: 10 mg via INTRAVENOUS
  Filled 2017-11-06: qty 4

## 2017-11-06 MED ORDER — SUCCINYLCHOLINE CHLORIDE 20 MG/ML IJ SOLN
INTRAMUSCULAR | Status: AC | PRN
  Administered 2017-11-06: 100 mg via INTRAVENOUS

## 2017-11-06 MED ORDER — IOPAMIDOL (ISOVUE-370) INJECTION 76%
INTRAVENOUS | Status: AC
  Administered 2017-11-06: 50 mL
  Filled 2017-11-06: qty 50

## 2017-11-06 MED ORDER — LABETALOL HCL 5 MG/ML IV SOLN: 20.0000 mg | Freq: Once | INTRAVENOUS | Status: DC

## 2017-11-06 MED ORDER — CLEVIDIPINE BUTYRATE 0.5 MG/ML IV EMUL
0.0000 mg/h | INTRAVENOUS | Status: DC
  Filled 2017-11-06: qty 50

## 2017-11-06 MED ORDER — INSULIN ASPART 100 UNIT/ML ~~LOC~~ SOLN
1.0000 [IU] | SUBCUTANEOUS | Status: DC
  Administered 2017-11-07: 3 [IU] via SUBCUTANEOUS
  Administered 2017-11-07 (×3): 2 [IU] via SUBCUTANEOUS
  Administered 2017-11-08: 1 [IU] via SUBCUTANEOUS
  Administered 2017-11-08 (×2): 2 [IU] via SUBCUTANEOUS
  Administered 2017-11-08 – 2017-11-09 (×3): 1 [IU] via SUBCUTANEOUS
  Administered 2017-11-09 (×2): 2 [IU] via SUBCUTANEOUS
  Administered 2017-11-09: 1 [IU] via SUBCUTANEOUS
  Filled 2017-11-06: qty 1

## 2017-11-06 MED ORDER — SODIUM CHLORIDE 0.9 % IV SOLN
INTRAVENOUS | Status: DC
Start: 2017-11-06 — End: 2017-11-09
  Administered 2017-11-06: 17:00:00 via INTRAVENOUS

## 2017-11-06 NOTE — Consult Note (Addendum)
Reason for Consult:Lobar left frontal intra-cranial hemorrhage Referring Physician: Kohut  Alexis Tran is an 85 y.o. female.  HPI: 85-year-old female brought in after being found lying poorly responsive.  She is apparently coming from an assisted living facility.  Last seen in her normal state of health approximately 24 hours ago.  She has been on unresponsive in her residence.  Unclear circumstances.  EMS reports initial GCS of 3.  Glucose in the 200s.  Sinus tachycardia.  Systolic blood pressures 140s to 160s.  2 IVs established prehospital.  Assisted ventilations via BVM. Further history unavailable.     Past Medical History:  Diagnosis Date  . Cataract of both eyes   . Deaf   . Diabetes mellitus without complication (HCC)   . Hemochromatosis     Past Surgical History:  Procedure Laterality Date  . CATARACT EXTRACTION    . COCHLEAR IMPLANT    . TONSILLECTOMY      No family history on file.  Social History:  reports that she has never smoked. She has never used smokeless tobacco. She reports that she does not drink alcohol or use drugs.  Allergies: No Known Allergies  Medications: I have reviewed the patient's current medications.  Results for orders placed or performed during the hospital encounter of 10/15/2017 (from the past 48 hour(s))  CBC with Differential     Status: Abnormal   Collection Time: 10/26/2017  4:05 PM  Result Value Ref Range   WBC 17.7 (H) 4.0 - 10.5 K/uL   RBC 4.58 3.87 - 5.11 MIL/uL   Hemoglobin 13.6 12.0 - 15.0 g/dL   HCT 40.4 36.0 - 46.0 %   MCV 88.2 78.0 - 100.0 fL   MCH 29.7 26.0 - 34.0 pg   MCHC 33.7 30.0 - 36.0 g/dL   RDW 14.1 11.5 - 15.5 %   Platelets 222 150 - 400 K/uL   Neutrophils Relative % 89 %   Neutro Abs 15.7 (H) 1.7 - 7.7 K/uL   Lymphocytes Relative 4 %   Lymphs Abs 0.8 0.7 - 4.0 K/uL   Monocytes Relative 7 %   Monocytes Absolute 1.2 (H) 0.1 - 1.0 K/uL   Eosinophils Relative 0 %   Eosinophils Absolute 0.0 0.0 - 0.7 K/uL    Basophils Relative 0 %   Basophils Absolute 0.0 0.0 - 0.1 K/uL    Comment: Performed at [facility], [address] [place], [place] [address]  Comprehensive metabolic panel     Status: Abnormal   Collection Time: 11/05/2017  4:05 PM  Result Value Ref Range   Sodium 136 135 - 145 mmol/L   Potassium 3.5 3.5 - 5.1 mmol/L   Chloride 100 (L) 101 - 111 mmol/L   CO2 22 22 - 32 mmol/L   Glucose, Bld 345 (H) 65 - 99 mg/dL   BUN 16 6 - 20 mg/dL   Creatinine, Ser 0.79 0.44 - 1.00 mg/dL   Calcium 8.8 (L) 8.9 - 10.3 mg/dL   Total Protein 6.1 (L) 6.5 - 8.1 g/dL   Albumin 3.7 3.5 - 5.0 g/dL   AST 58 (H) 15 - 41 U/L   ALT 23 14 - 54 U/L   Alkaline Phosphatase 37 (L) 38 - 126 U/L   Total Bilirubin 1.1 0.3 - 1.2 mg/dL   GFR calc non Af Amer >60 >60 mL/min   GFR calc Af Amer >60 >60 mL/min    Comment: (NOTE) The eGFR has been calculated using the CKD EPI equation. This   calculation has not been validated in all clinical situations. eGFR's persistently <60 mL/min signify possible Chronic Kidney Disease.    Anion gap 14 5 - 15    Comment: Performed at [facility] [address], [place], [place] [address]  Troponin I     Status: Abnormal   Collection Time: 10/11/2017  4:05 PM  Result Value Ref Range   Troponin I 0.68 (HH) <0.03 ng/mL    Comment: CRITICAL RESULT CALLED TO, READ BACK BY AND VERIFIED WITH: C.FLITF,RN 11/03/2017 1732 DAVISB Performed at [facility], [place] [address] [place], [place] [address]   Magnesium     Status: None   Collection Time: 11/05/2017  4:05 PM  Result Value Ref Range   Magnesium 1.8 1.7 - 2.4 mg/dL    Comment: Performed at [facility], [place] [address], [place], [place] [address]  Ammonia     Status: Abnormal   Collection Time: 10/13/2017  4:05 PM  Result Value Ref Range   Ammonia 45 (H) 9 - 35 umol/L    Comment: Performed at [facility], [place] [address], [place], [place] [address]  Urinalysis, Routine w reflex microscopic     Status:  Abnormal   Collection Time: 10/11/2017  4:11 PM  Result Value Ref Range   Color, Urine YELLOW YELLOW   APPearance HAZY (A) CLEAR   Specific Gravity, Urine 1.011 1.005 - 1.030   pH 7.0 5.0 - 8.0   Glucose, UA >=500 (A) NEGATIVE mg/dL   Hgb urine dipstick SMALL (A) NEGATIVE   Bilirubin Urine NEGATIVE NEGATIVE   Ketones, ur 20 (A) NEGATIVE mg/dL   Protein, ur 100 (A) NEGATIVE mg/dL   Nitrite NEGATIVE NEGATIVE   Leukocytes, UA NEGATIVE NEGATIVE   RBC / HPF 0-5 0 - 5 RBC/hpf   WBC, UA 0-5 0 - 5 WBC/hpf   Bacteria, UA NONE SEEN NONE SEEN    Comment: Performed at [facility] [address] [place], [place] [address]  Lactic acid, plasma     Status: Abnormal   Collection Time: 10/11/2017  4:32 PM  Result Value Ref Range   Lactic Acid, Venous 4.5 (HH) 0.5 - 1.9 mmol/L    Comment: CRITICAL RESULT CALLED TO, READ BACK BY AND VERIFIED WITH: C.PHIPPS,RN 10/18/2017 1851 DAVISB Performed at [facility], [place] [address], [place], [place] [address]   I-Stat Arterial Blood Gas, ED - (order at [facility] and [facility] only)     Status: Abnormal   Collection Time: 11/01/2017  4:34 PM  Result Value Ref Range   pH, Arterial 7.371 7.350 - 7.450   pCO2 arterial 39.7 32.0 - 48.0 mmHg   pO2, Arterial 410.0 (H) 83.0 - 108.0 mmHg   Bicarbonate 22.7 20.0 - 28.0 mmol/L   TCO2 24 22 - 32 mmol/L   O2 Saturation 100.0 %   Acid-base deficit 2.0 0.0 - 2.0 mmol/L   Patient temperature 38.2 C    Collection site RADIAL, ALLEN'S TEST ACCEPTABLE    Drawn by RT    Sample type ARTERIAL     Ct Angio Head W Or Wo Contrast  Result Date: 10/12/2017 CLINICAL DATA:  Follow-up intracranial hemorrhage. History of diabetes. EXAM: CT ANGIOGRAPHY HEAD AND NECK TECHNIQUE: Multidetector CT imaging of the head and neck was performed using the standard protocol during bolus administration of intravenous contrast. Multiplanar CT image reconstructions and MIPs were obtained to evaluate the vascular anatomy. Carotid stenosis measurements  (when applicable) are  obtained utilizing NASCET criteria, using the distal internal carotid diameter as the denominator. CONTRAST:  4m ISOVUE-370 IOPAMIDOL (ISOVUE-370) INJECTION 76% COMPARISON:  CT HEAD November 06, 2017 FINDINGS: CTA NECK FINDINGS: AORTIC ARCH: Normal appearance of the thoracic arch, normal branch pattern. Mild intimal thickening calcific atherosclerosis the origins of the innominate, left Common carotid artery and subclavian artery are widely patent. RIGHT CAROTID SYSTEM: Common carotid artery is patent. Normal appearance of the carotid bifurcation without hemodynamically significant stenosis by NASCET criteria. Patent internal carotid artery with mild luminal irregularity most compatible with atherosclerosis. LEFT CAROTID SYSTEM: Common carotid artery is patent though partially obscured by venous contamination streak artifact. Normal appearance of the carotid bifurcation without hemodynamically significant stenosis by NASCET criteria. Patent internal carotid artery with mild luminal irregularity most compatible with atherosclerosis. VERTEBRAL ARTERIES:Left vertebral artery is dominant. Mild luminal irregularity of the vertebral arteries compatible with atherosclerosis. No dissection. SKELETON: No acute osseous process though bone windows have not been submitted. Multiple loose bodies RIGHT shoulder. Status post LEFT shoulder arthroplasty. Severe degenerative change of the cervical spine. OTHER NECK: Soft tissues of the neck are nonacute though, not tailored for evaluation. Multiple calcified thyroid nodules. UPPER CHEST: Biapical pleuroparenchymal scarring. Life-support lines in place. CTA HEAD FINDINGS: ANTERIOR CIRCULATION: Patent cervical internal carotid arteries, petrous, cavernous and supra clinoid internal carotid arteries, dolichoectasia seen with chronic hypertension. 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 Patent anterior communicating artery. Patent anterior and middle  cerebral arteries. No large vessel occlusion, significant stenosis, contrast extravasation or aneurysm. POSTERIOR CIRCULATION: Patent vertebral arteries, vertebrobasilar junction and basilar artery, as well as main branch vessels. Dolichoectatic posterior circulation seen with chronic hypertension. Patent posterior cerebral arteries. No large vessel occlusion, significant stenosis, contrast extravasation or aneurysm. VENOUS SINUSES: Major dural venous sinuses are patent though not tailored for evaluation on this angiographic examination. ANATOMIC VARIANTS: None. DELAYED PHASE: No abnormal intracranial enhancement. Multiple scalp hematomas. MIP images reviewed. IMPRESSION: CTA NECK: 1. No hemodynamically significant stenosis or acute vascular process in the neck. CTA HEAD: 1. No emergent large vessel occlusion or flow limiting stenosis. 2. 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 3. Multiple scalp hematomas. Electronically Signed   By: CElon AlasM.D.   On: 10/15/2017 19:40   Ct Head Wo Contrast  Addendum Date: 10/30/2017   ADDENDUM REPORT: 10/19/2017 19:41 ADDENDUM: Scalp soft tissue thickening at the vertex of the skull extending to the right lateral frontal region compatible with contusion. Electronically Signed   By: LKristine GarbeM.D.   On: 11/01/2017 19:41   Result Date: 10/20/2017 CLINICAL DATA:  84y/o  F; found unresponsive. EXAM: CT HEAD WITHOUT CONTRAST TECHNIQUE: Contiguous axial images were obtained from the base of the skull through the vertex without intravenous contrast. COMPARISON:  None. FINDINGS: Brain: There multiple small foci of acute brain parenchymal hemorrhage within the dorsal vermis, left superior cerebellar hemisphere, and left superior frontal lobe without significant mass effect. There is a large parenchymal hemorrhage within the left frontal lobe measuring 8.2 x 4.7 x 5.4 cm (volume = 110 cm^3) series 3, image 17 and series 5, image 22. Mass effect associated  with the large left frontal hematoma results in 5 mm of left-to-right midline shift. Additionally, there is a small volume of subarachnoid hemorrhage over the left frontal convexity no additional areas of subarachnoid hemorrhage over the right frontal and parietal lobes as well as over the cerebellum. Streak artifact from the cochlear implant partially obscures the midportion of the brain. Vascular:  Calcific atherosclerosis of carotid siphons. Skull: Normal. Negative for fracture or focal lesion. Sinuses/Orbits: Right wall up mastoidectomy with small amount of fluid in the mastoid ectomy pole and in the right mastoid tip. Cochlear implant on the right in situ. Normal aeration of paranasal sinuses. Small right sphenoid sinus fluid level. Other: None. IMPRESSION: 1. Large left frontal lobe hemorrhage measuring up to 8.2 cm, 110 cc. Associated edema and mass effect with 5 mm left-to-right midline shift. 2. Multiple additional small foci of brain parenchymal hemorrhage within dorsal vermis, left superior cerebellum, and left superior frontal lobe without significant mass effect. 3. Small volume subarachnoid hemorrhage predominantly over left frontal convexity with additional areas over the right frontal and parietal lobes and cerebellum. Critical Value/emergent results were called by telephone at the time of interpretation on 10/13/2017 at 5:24 pm to Dr. STEPHEN KOHUT , who verbally acknowledged these results. Electronically Signed: By: Lance  Furusawa-Stratton M.D. On: 10/29/2017 17:26   Ct Angio Neck W And/or Wo Contrast  Result Date: 10/31/2017 CLINICAL DATA:  Follow-up intracranial hemorrhage. History of diabetes. EXAM: CT ANGIOGRAPHY HEAD AND NECK TECHNIQUE: Multidetector CT imaging of the head and neck was performed using the standard protocol during bolus administration of intravenous contrast. Multiplanar CT image reconstructions and MIPs were obtained to evaluate the vascular anatomy. Carotid stenosis  measurements (when applicable) are obtained utilizing NASCET criteria, using the distal internal carotid diameter as the denominator. CONTRAST:  50mL ISOVUE-370 IOPAMIDOL (ISOVUE-370) INJECTION 76% COMPARISON:  CT HEAD November 06, 2017 FINDINGS: CTA NECK FINDINGS: AORTIC ARCH: Normal appearance of the thoracic arch, normal branch pattern. Mild intimal thickening calcific atherosclerosis the origins of the innominate, left Common carotid artery and subclavian artery are widely patent. RIGHT CAROTID SYSTEM: Common carotid artery is patent. Normal appearance of the carotid bifurcation without hemodynamically significant stenosis by NASCET criteria. Patent internal carotid artery with mild luminal irregularity most compatible with atherosclerosis. LEFT CAROTID SYSTEM: Common carotid artery is patent though partially obscured by venous contamination streak artifact. Normal appearance of the carotid bifurcation without hemodynamically significant stenosis by NASCET criteria. Patent internal carotid artery with mild luminal irregularity most compatible with atherosclerosis. VERTEBRAL ARTERIES:Left vertebral artery is dominant. Mild luminal irregularity of the vertebral arteries compatible with atherosclerosis. No dissection. SKELETON: No acute osseous process though bone windows have not been submitted. Multiple loose bodies RIGHT shoulder. Status post LEFT shoulder arthroplasty. Severe degenerative change of the cervical spine. OTHER NECK: Soft tissues of the neck are nonacute though, not tailored for evaluation. Multiple calcified thyroid nodules. UPPER CHEST: Biapical pleuroparenchymal scarring. Life-support lines in place. CTA HEAD FINDINGS: ANTERIOR CIRCULATION: Patent cervical internal carotid arteries, petrous, cavernous and supra clinoid internal carotid arteries, dolichoectasia seen with chronic hypertension. 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 Patent anterior communicating artery. Patent anterior and  middle cerebral arteries. No large vessel occlusion, significant stenosis, contrast extravasation or aneurysm. POSTERIOR CIRCULATION: Patent vertebral arteries, vertebrobasilar junction and basilar artery, as well as main branch vessels. Dolichoectatic posterior circulation seen with chronic hypertension. Patent posterior cerebral arteries. No large vessel occlusion, significant stenosis, contrast extravasation or aneurysm. VENOUS SINUSES: Major dural venous sinuses are patent though not tailored for evaluation on this angiographic examination. ANATOMIC VARIANTS: None. DELAYED PHASE: No abnormal intracranial enhancement. Multiple scalp hematomas. MIP images reviewed. IMPRESSION: CTA NECK: 1. No hemodynamically significant stenosis or acute vascular process in the neck. CTA HEAD: 1. No emergent large vessel occlusion or flow limiting stenosis. 2. 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 3. Multiple   scalp hematomas. Electronically Signed   By: Courtnay  Bloomer M.D.   On: 10/15/2017 19:40   Dg Chest Portable 1 View  Result Date: 10/17/2017 CLINICAL DATA:  Intubated. EXAM: PORTABLE CHEST 1 VIEW COMPARISON:  07/09/2015. FINDINGS: Normal sized heart. Interval endotracheal tube in satisfactory position. Interval nasogastric tube with its tip in the proximal stomach and side hole at the gastroesophageal junction. Decreased inspiration with mild right basilar atelectasis. The interstitial markings remain minimally prominent. Right shoulder degenerative changes and bursal calcifications. Interval left shoulder prosthesis. Marked thoracolumbar scoliosis without significant change. IMPRESSION: 1. Endotracheal tube in satisfactory position and nasogastric tube tip at the gastroesophageal junction. 2. Decreased inspiration with mild right basilar atelectasis and possible pneumonia. 3. Stable minimal chronic interstitial lung disease. Electronically Signed   By: Steven  Reid M.D.   On: 10/26/2017 15:40    Review of  Systems - Negative except as above    Blood pressure (!) 149/74, pulse (!) 103, temperature (!) 103.1 F (39.5 C), temperature source Bladder, resp. rate (!) 21, height 5' 4" (1.626 m), weight 50 kg (110 lb 3.7 oz), SpO2 100 %. Physical Exam  Constitutional: She appears well-developed and well-nourished.  HENT:  Head: Normocephalic.  Right cochlear implant  Eyes:  Bilateral cataracts.  Minimally reactive 2 mm pupils.  Neurological: She is unresponsive. GCS eye subscore is 1. GCS verbal subscore is 1. GCS motor subscore is 4.  Patient is unresponsive.  She moves left arm to noxious stimulus.      Assessment/Plan: Patient is unresponsive.  She has a large left frontal lobar hemorrhage (consider amyloid angiopathy).  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 Prognosis for meaningful recovery is extremely poor.  I would not recommend surgery and would recommend comfort/palliative care.  Please note, she cannot have MRI brain due to cochlear implant.      , D, MD 10/16/2017, 7:44 PM  

## 2017-11-06 NOTE — ED Notes (Signed)
PT intubated on first attempt. VSS. Bilateral breath sounds

## 2017-11-06 NOTE — Progress Notes (Signed)
Patient transported to CT and back to trauma A without any apparent complications. 

## 2017-11-06 NOTE — Consult Note (Signed)
Neurology Consultation  Reason for Consult: ICH Referring Physician: Drs Juleen China (EDP) and Minette Headland (PCCM)  CC: unresponsive at her assisted living facility  History is obtained from:chart  HPI: Alexis Tran is a 84 y.o. female with PMH of deafness, bilateral cataracts, who was brought in when she was noticed to be found unresponsive at the facility per chart notes. She had not been seen for 24 hrs prior. On EMS arrival, she was a GCS 3 and was intubated in ER. NCCT head showed a large left frontal ICH and she was admitted to ICU. Neurology was consulted for management of ICH. No family currently at bedside but EDP has been in communication with family (POA who lives in [place]). Currently waiting family arrival from out of state for further code status/management decisions. Pending that, she is currently full code and scope of treatment from what I can tell from chart review. Presumably neurosurgery has been consulted over the phone and offered no surgical intervention at this time.   LKW: Unknown tpa given?: no, not an ischemic stroke-is an ICH Premorbid modified Rankin scale (mRS): Unclear but per some verbal report at least 3-4 ICH score- 4  ROS:  Unable to obtain due to altered mental status.   Past Medical History:  Diagnosis Date  . Cataract of both eyes   . Deaf   . Diabetes mellitus without complication (HCC)   . Hemochromatosis     No family history on file. Patient unable to provide  Social History:   reports that she has never smoked. She has never used smokeless tobacco. She reports that she does not drink alcohol or use drugs. Patient unable to provide  Medications  Current Facility-Administered Medications:  .  0.9 %  sodium chloride infusion, , Intravenous, Continuous, Raeford Razor, MD, Last Rate: 50 mL/hr at 10/12/2017 1637 .  0.9 %  sodium chloride infusion, 250 mL, Intravenous, PRN, Aljishi, Wael Z, MD .  0.9 %  sodium chloride infusion, , Intravenous,  Continuous, Aljishi, Wael Z, MD .  dexmedetomidine (PRECEDEX) 200 MCG/50ML (4 mcg/mL) infusion, 0-1.2 mcg/kg/hr, Intravenous, Continuous, Aljishi, Vashti Hey, MD .  famotidine (PEPCID) IVPB 20 mg premix, 20 mg, Intravenous, Q12H, Aljishi, Wael Z, MD .  insulin aspart (novoLOG) injection 1-3 Units, 1-3 Units, Subcutaneous, Q4H, Aljishi, Vashti Hey, MD .  labetalol (NORMODYNE,TRANDATE) injection 10 mg, 10 mg, Intravenous, Once, Raeford Razor, MD .  losartan (COZAAR) tablet 50 mg, 50 mg, Oral, Daily, Aljishi, Wael Z, MD .  nicardipine (CARDENE)  in 0.86% saline IV infusion (0.1 mg/ml), 3-15 mg/hr, Intravenous, Continuous, Aljishi, Vashti Hey, MD  Current Outpatient Medications:  .  alendronate (FOSAMAX) 70 MG tablet, Take 70 mg by mouth once a week. Take with a full glass of water on an empty stomach., Disp: , Rfl:  .  ampicillin (PRINCIPEN) 500 MG capsule, Take 2,000 mg by mouth See admin instructions. 1 hour prior to Dental Appointment, Disp: , Rfl:  .  glimepiride (AMARYL) 2 MG tablet, Take 2 mg by mouth daily with breakfast., Disp: , Rfl:  .  ipratropium (ATROVENT) 0.06 % nasal spray, Place 2 sprays into both nostrils 3 (three) times daily., Disp: , Rfl:  .  losartan (COZAAR) 50 MG tablet, Take 50 mg by mouth daily., Disp: , Rfl:  .  metFORMIN (GLUCOPHAGE-XR) 500 MG 24 hr tablet, Take 500 mg by mouth 2 (two) times daily., Disp: , Rfl:  .  montelukast (SINGULAIR) 10 MG tablet, Take 10 mg by mouth at bedtime., Disp: ,  Rfl:  .  prednisoLONE acetate (PRED FORTE) 1 % ophthalmic suspension, Place 1 drop into the left eye 2 (two) times daily., Disp: , Rfl:  .  simvastatin (ZOCOR) 10 MG tablet, Take 10 mg by mouth daily., Disp: , Rfl:  .  travoprost, benzalkonium, (TRAVATAN) 0.004 % ophthalmic solution, Place 1 drop into the right eye daily., Disp: , Rfl:  .  celecoxib (CELEBREX) 200 MG capsule, Take 1 capsule (200 mg total) by mouth daily. (Patient not taking: Reported on 11/04/2017), Disp: 60 capsule,  Rfl: 1 .  diclofenac sodium (VOLTAREN) 1 % GEL, Apply 4 g topically 4 (four) times daily., Disp: , Rfl:  .  methimazole (TAPAZOLE) 5 MG tablet, Take 5 mg by mouth daily., Disp: , Rfl:   Exam: Current vital signs: BP (!) 146/76   Pulse 100   Temp (!) 103.1 F (39.5 C)   Resp (!) 21   Ht  (1.626 m)   Wt 50 kg (110 lb 3.7 oz)   SpO2 99%   BMI 18.92 kg/m  Vital signs in last 24 hours: Temp:  [98.2 F (36.8 C)-103.1 F (39.5 C)] 103.1 F (39.5 C) (04/30 1915) Pulse Rate:  [97-142] 100 (04/30 1915) Resp:  [18-26] 21 (04/30 1915) BP: (133-183)/(61-122) 146/76 (04/30 1915) SpO2:  [96 %-100 %] 99 % (04/30 1915) FiO2 (%):  [40 %-100 %] 40 % (04/30 1725) Weight:  [50 kg (110 lb 3.7 oz)] 50 kg (110 lb 3.7 oz) (04/30 1530) General: Sedated intubated H ENT: Normocephalic atraumatic CVS: S1-S2 heard Respiratory: Chest clear to auscultation Abdomen: Nondistended nontender Extremities: Warm well perfused Neurological exam Patient is sedated, intubated She is breathing above the ventilator Does not follow any commands Does not open eyes to voice or noxious stim. Cranial nerves: Pupils are 2 mm barely reactive, corneals are present bilaterally, breathing over the ventilator, facial symmetry difficult to ascertain. Motor exam: Moves left arm spontaneously and withdraws briskly to pain.  Does not withdrawal right upper or lower extremity to pain.  Has some extensor posturing in the right upper extremity to noxious edematous. Left lower extremity has minimal withdrawal to pain. Sensory exam: As above Coordination and gait cannot be assessed NIHSS 1a Level of Conscious.: 2 1b LOC Questions: 2 1c LOC Commands: 2 2 Best Gaze: 0 3 Visual: 2 4 Facial Palsy: 0 5a Motor Arm - left: 1 5b Motor Arm - Right: 3 6a Motor Leg - Left: 2 6b Motor Leg - Right: 3 7 Limb Ataxia: 0 8 Sensory: 2 9 Best Language: 3 10 Dysarthria: 2 11 Extinct. and Inatten.: 0 TOTAL: 24  Labs I have reviewed  labs in epic and the results pertinent to this consultation are:  CBC    Component Value Date/Time   WBC 17.7 (H) 10/17/2017 1605   RBC 4.58 10/23/2017 1605   HGB 13.6 10/31/2017 1605   HCT 40.4 10/10/2017 1605   PLT 222 10/18/2017 1605   MCV 88.2 10/30/2017 1605   MCH 29.7 10/23/2017 1605   MCHC 33.7 10/15/2017 1605   RDW 14.1 10/23/2017 1605   LYMPHSABS 0.8 10/23/2017 1605   MONOABS 1.2 (H) 10/28/2017 1605   EOSABS 0.0 10/31/2017 1605   BASOSABS 0.0 10/10/2017 1605    CMP     Component Value Date/Time   NA 136 10/27/2017 1605   K 3.5 10/16/2017 1605   CL 100 (L) 10/23/2017 1605   CO2 22 10/10/2017 1605   GLUCOSE 345 (H) 10/27/2017 1605   BUN 16 10/24/2017  1605   CREATININE 0.79 11/05/2017 1605   CALCIUM 8.8 (L) 10/24/2017 1605   PROT 6.1 (L) 10/27/2017 1605   ALBUMIN 3.7 10/16/2017 1605   AST 58 (H) 10/20/2017 1605   ALT 23 10/20/2017 1605   ALKPHOS 37 (L) 10/10/2017 1605   BILITOT 1.1 10/11/2017 1605   GFRNONAA >60 10/12/2017 1605   GFRAA >60 10/24/2017 1605   Imaging I have reviewed the images obtained:  CT-scan of the brain large left frontal ICH of about 110 cc with associated edema mass-effect and 5 mm left-to-right MLS.  Multiple additional areas of ICH within the dorsal vermis, left superior cerebellum and left superior frontal lobe without significant mass-effect.  Small volume subarachnoid hemorrhage predominantly over the left frontal convexity with additional areas of the right frontal and parietal lobes in the cerebellum. CT Angio of the head and neck pending at this time.  Preliminary review shows no underlying vascular malformation  Assessment:  84 year old woman past history of deafness, bilateral cataracts with very poor visual acuity brought in for evaluation of unresponsiveness from an assisted living facility.  She was a GCS 3 and had to be intubated for airway protection. Noncontrast CT of the head shows a large frontal ICH measuring 110 cc-ICH  score of 4, which portends 97% mortality at the end of 30 days. Large lobar hemorrhage, likely secondary to cerebral amyloid angiopathy. Other etiologies could be hypertension.  Impression: ICH-lobar  Recommendations: Vent management per PCCM. Strict blood pressure management with systolic blood pressure less than 140 No antiplatelets or anticoagulants. Stat CT head in case of neurological deterioration or change in exam-example blown pupil. Agree with palliative conversations due to poor prognosis with the ICH score at presentation being 4. Consider MRI with and without contrast for further evaluation if she continues to be full scope of care after family meetings. Stroke service will be available and follow the patient in the morning. Communicated my plan in person to the ED provider Dr. Juleen China. Please call with questions.  -- Milon Dikes, MD [facility] Pager: [phone] If 7pm to 7am, please call on call as listed on AMION.  CRITICAL CARE ATTESTATION This patient is critically ill and at significant risk of neurological worsening, death and care requires constant monitoring of vital signs, hemodynamics,respiratory and cardiac monitoring. I spent 40  minutes of neurocritical care time performing neurological assessment, discussion with family, other specialists and medical decision making of high complexity in the care of  this patient.

## 2017-11-06 NOTE — ED Notes (Signed)
Dr. Juleen China aware pt core temp increased to 103 despite PR tylenol administration. No further orders received at this time. Will continue to monitor.

## 2017-11-06 NOTE — ED Triage Notes (Signed)
Pt arrives to ED via EMS from [facility] independent living with GCS 3, LKW >24 hours ago, respirations being assisted with BVM. Pt tachycardic 144, bp 180/104. No response with sternal rub.   PT intubated on arrival to ED.

## 2017-11-06 NOTE — ED Provider Notes (Signed)
[facility] EMERGENCY DEPARTMENT Provider Note   CSN: [id number] Arrival date & time: 10/14/2017  1509     History   Chief Complaint Chief Complaint  Patient presents with  . Unresponsive    HPI Alexis Tran is a 84 y.o. female.  HPI   84 year old female brought in after being found lying poorly responsive.  She is apparently coming from an assisted living facility.  Last seen in her normal state of health approximately 24 hours ago.  She has been on unresponsive in her residence.  Unclear circumstances.  EMS reports initial GCS of 3.  Glucose in the 200s.  Sinus tachycardia.  Systolic blood pressures 160V to 160s.  2 IVs established prehospital.  Assisted ventilations via BVM. Further history unavailable.   Past Medical History:  Diagnosis Date  . Cataract of both eyes   . Deaf   . Diabetes mellitus without complication   . Hemochromatosis     There are no active problems to display for this patient.   Past Surgical History:  Procedure Laterality Date  . CATARACT EXTRACTION    . COCHLEAR IMPLANT    . TONSILLECTOMY       OB History   None      Home Medications    Prior to Admission medications   Medication Sig Start Date End Date Taking? Authorizing Provider  celecoxib (CELEBREX) 200 MG capsule Take 1 capsule (200 mg total) by mouth daily. 05/04/16   Suzan Slick, NP  doxycycline (DORYX) 100 MG DR capsule Take 100 mg by mouth 2 (two) times daily.    [provider]  METFORMIN HCL PO Take by mouth.    [provider]  Travoprost (TRAVATAN OP) Apply to eye.    [provider]    Family History No family history on file.  Social History Social History   Tobacco Use  . Smoking status: Never Smoker  Substance Use Topics  . Alcohol use: No  . Drug use: No     Allergies   Patient has no known allergies.   Review of Systems Review of Systems  Level 5 caveat because pt is unresponsive.  Physical  Exam Updated Vital Signs Ht _0  (1.626 m)   BMI 17.68 kg/m   Physical Exam  Constitutional: She appears distressed.  HENT:  Head: Normocephalic and atraumatic.  Eyes: Right eye exhibits no discharge. Left eye exhibits no discharge.  B/l corneal opacities  Neck: Neck supple.  Cardiovascular: Normal heart sounds. Exam reveals no gallop and no friction rub.  No murmur heard. tachycardic  Pulmonary/Chest: No respiratory distress.  Crackles b/l  Abdominal: Soft. She exhibits no distension. There is no tenderness.  Musculoskeletal: She exhibits no edema or tenderness.  Neurological: GCS eye subscore is 1. GCS verbal subscore is 1. GCS motor subscore is 4.  GCS 6.  Some spontaneous movement noted of left upper extremity, but not clearly purposeful.  Withdraws all extremities to pain.  Skin: Skin is warm and dry.  Nursing note and vitals reviewed.    ED Treatments / Results  Labs (all labs ordered are listed, but only abnormal results are displayed) Labs Reviewed  CBC WITH DIFFERENTIAL/PLATELET - Abnormal; Notable for the following components:      Result Value   WBC 17.7 (*)    Neutro Abs 15.7 (*)    Monocytes Absolute 1.2 (*)    All other components within normal limits  COMPREHENSIVE METABOLIC PANEL - Abnormal; Notable for the following  components:   Chloride 100 (*)    Glucose, Bld 345 (*)    Calcium 8.8 (*)    Total Protein 6.1 (*)    AST 58 (*)    Alkaline Phosphatase 37 (*)    All other components within normal limits  TROPONIN I - Abnormal; Notable for the following components:   Troponin I 0.68 (*)    All other components within normal limits  URINALYSIS, ROUTINE W REFLEX MICROSCOPIC - Abnormal; Notable for the following components:   APPearance HAZY (*)    Glucose, UA >=500 (*)    Hgb urine dipstick SMALL (*)    Ketones, ur 20 (*)    Protein, ur 100 (*)    All other components within normal limits  I-STAT ARTERIAL BLOOD GAS, ED - Abnormal; Notable for the  following components:   pO2, Arterial 410.0 (*)    All other components within normal limits  CULTURE, BLOOD (ROUTINE X 2)  CULTURE, BLOOD (ROUTINE X 2)  URINE CULTURE  MAGNESIUM  BRAIN NATRIURETIC PEPTIDE  LACTIC ACID, PLASMA  LACTIC ACID, PLASMA  AMMONIA  PROTIME-INR    EKG EKG Interpretation  Date/Time:  Tuesday November 06 2017 15:12:11 EDT Ventricular Rate:  142 PR Interval:    QRS Duration: 91 QT Interval:  291 QTC Calculation: 448 R Axis:   54 Text Interpretation:  Sinus tachycardia LVH  anterior STE Baseline wander in lead(s) V4 No old tracing to compare Confirmed by Virgel Manifold [phone]) on 10/19/2017 9:52:21 PM   Radiology Dg Chest Portable 1 View  Result Date: 10/10/2017 CLINICAL DATA:  Intubated. EXAM: PORTABLE CHEST 1 VIEW COMPARISON:  07/09/2015. FINDINGS: Normal sized heart. Interval endotracheal tube in satisfactory position. Interval nasogastric tube with its tip in the proximal stomach and side hole at the gastroesophageal junction. Decreased inspiration with mild right basilar atelectasis. The interstitial markings remain minimally prominent. Right shoulder degenerative changes and bursal calcifications. Interval left shoulder prosthesis. Marked thoracolumbar scoliosis without significant change. IMPRESSION: 1. Endotracheal tube in satisfactory position and nasogastric tube tip at the gastroesophageal junction. 2. Decreased inspiration with mild right basilar atelectasis and possible pneumonia. 3. Stable minimal chronic interstitial lung disease. Electronically Signed   By: Claudie Revering M.D.   On: 10/14/2017 15:40    Procedures Procedures (including critical care time)  INTUBATION Performed by: Virgel Manifold  Required items: required blood products, implants, devices, and special equipment available Patient identity confirmed: provided demographic data and hospital-assigned identification number Time out: Immediately prior to procedure a "time out" was called  to verify the correct patient, procedure, equipment, support staff and site/side marked as required.  Indications: airway protection  Intubation method: Direct. Mac 3.   Preoxygenation: BVM  Sedatives: Etomidate Paralytic: Succinylcholine  Tube Size: 7.5 cuffed  Post-procedure assessment: chest rise and ETCO2 monitor Breath sounds: equal and absent over the epigastrium Tube secured with: ETT holder Chest x-ray interpreted by radiologist and me.  Chest x-ray findings: endotracheal tube in appropriate position  Patient tolerated the procedure well with no immediate complications.  CRITICAL CARE Performed by: Virgel Manifold Total critical care time: 80 minutes Critical care time was exclusive of separately billable procedures and treating other patients. Critical care was necessary to treat or prevent imminent or life-threatening deterioration. Critical care was time spent personally by me on the following activities: development of treatment plan with patient and/or surrogate as well as nursing, discussions with consultants, evaluation of patient's response to treatment, examination of patient, obtaining history from patient or surrogate,  ordering and performing treatments and interventions, ordering and review of laboratory studies, ordering and review of radiographic studies, pulse oximetry and re-evaluation of patient's condition.    Medications Ordered in ED Medications  vancomycin (VANCOCIN) IVPB 1000 mg/200 mL premix (has no administration in time range)  ceFEPIme (MAXIPIME) 1 g in sodium chloride 0.9 % 100 mL IVPB (has no administration in time range)  0.9 %  sodium chloride infusion (has no administration in time range)  etomidate (AMIDATE) injection (15 mg Intravenous Given 10/12/2017 1518)  succinylcholine (ANECTINE) injection (100 mg Intravenous Given 11/05/2017 1518)     Initial Impression / Assessment and Plan / ED Course  I have reviewed the triage vital signs and the  nursing notes.  Pertinent labs & imaging results that were available during my care of the patient were reviewed by me and considered in my medical decision making (see chart for details).     85yF found unresponsive. EMS reports initial GCS 3. GCS 6 on my initial assessment. CT head with multiple areas of hemorrhage including large L frontal hematoma exhibiting some mass effect. Discussed with Dr Vertell Limber, neurosurgery, who reviewed imaging. He doesn't feel like she is a good candidate for surgical intervention. Has cochlear implant. Cannot obtain MRI. Will CTa.   Will discuss with neurology. I briefly spoke with her sister in [place], [place] who is apparently POA. Pt had just arrived to the ED at that point though and I didn't have CT results to discuss with her at that point. Will try to contact her again to re-address goals of care.   6:05 PM Discussed with her sister Pricilla Larsson, [place]. Discussed the gravity of her condition and her poor prognosis. She is not ready to make decision to withdrawal care at this time.   [phone] (land line) [phone] (cell)  Final Clinical Impressions(s) / ED Diagnoses   Final diagnoses:  Respiratory failure ([place])  Intracranial bleed [facility])    ED Discharge Orders    None       Virgel Manifold, MD 11/08/17 2258

## 2017-11-06 NOTE — ED Notes (Signed)
Pt transported to CT with RN and RT

## 2017-11-06 NOTE — H&P (Signed)
PULMONARY / CRITICAL CARE MEDICINE   Name: Alexis Tran MRN: [id number] DOB: [birth year]    ADMISSION DATE:  10/17/2017 CONSULTATION DATE:  10/27/2017   REFERRING MD:  ED CHIEF COMPLAINT:  Altered mental status   HISTORY OF PRESENT ILLNESS:   84 yr old lady with known HTN lives in an assisted living was brought in with altered mental status. Patient was not seen for around 24hrs and when was seen she had a GCS of 3 and was intubated in the ED. CT showed massive left frontal ICH with other areas of smaller bleeds.   Patient was assessed by neurosurgery and there is no intervention to be done and has a very poor prognosis. Neurology recommended CT angio.   I came to assess the patient in the ED and obviously she is not able to give any history on the vent and not responsive. No family around.   PAST MEDICAL HISTORY :  She  has a past medical history of Cataract of both eyes, Deaf, Diabetes mellitus without complication (HCC), and Hemochromatosis.  PAST SURGICAL HISTORY: She  has a past surgical history that includes Tonsillectomy; Cataract extraction; and Cochlear implant.  No Known Allergies  No current facility-administered medications on file prior to encounter.    Current Outpatient Medications on File Prior to Encounter  Medication Sig  . alendronate (FOSAMAX) 70 MG tablet Take 70 mg by mouth once a week. Take with a full glass of water on an empty stomach.  Marland Kitchen ampicillin (PRINCIPEN) 500 MG capsule Take 2,000 mg by mouth See admin instructions. 1 hour prior to Dental Appointment  . glimepiride (AMARYL) 2 MG tablet Take 2 mg by mouth daily with breakfast.  . ipratropium (ATROVENT) 0.06 % nasal spray Place 2 sprays into both nostrils 3 (three) times daily.  Marland Kitchen losartan (COZAAR) 50 MG tablet Take 50 mg by mouth daily.  . metFORMIN (GLUCOPHAGE-XR) 500 MG 24 hr tablet Take 500 mg by mouth 2 (two) times daily.  . montelukast (SINGULAIR) 10 MG tablet Take 10 mg by mouth at bedtime.  .  prednisoLONE acetate (PRED FORTE) 1 % ophthalmic suspension Place 1 drop into the left eye 2 (two) times daily.  . simvastatin (ZOCOR) 10 MG tablet Take 10 mg by mouth daily.  . travoprost, benzalkonium, (TRAVATAN) 0.004 % ophthalmic solution Place 1 drop into the right eye daily.  . celecoxib (CELEBREX) 200 MG capsule Take 1 capsule (200 mg total) by mouth daily. (Patient not taking: Reported on 10/13/2017)  . diclofenac sodium (VOLTAREN) 1 % GEL Apply 4 g topically 4 (four) times daily.  . methimazole (TAPAZOLE) 5 MG tablet Take 5 mg by mouth daily.    FAMILY HISTORY:  Could not be obtained due to altered mental status   SOCIAL HISTORY: Could not be obtained due to altered mental status   REVIEW OF SYSTEMS:   Could not be obtained due to altered mental status     VITAL SIGNS: BP 133/61   Pulse 97   Temp (!) 102.4 F (39.1 C)   Resp 19   Ht  (1.626 m)   Wt 50 kg (110 lb 3.7 oz)   SpO2 97%   BMI 18.92 kg/m   HEMODYNAMICS:    VENTILATOR SETTINGS: Vent Mode: PRVC FiO2 (%):  [40 %-100 %] 40 % Set Rate:  [14 bmp] 14 bmp Vt Set:  [440 mL] 440 mL PEEP:  [5 cmH20] 5 cmH20 Plateau Pressure:  [8 cmH20-20 cmH20] 8 cmH20  INTAKE / OUTPUT:  No intake/output data recorded.  PHYSICAL EXAMINATION: General:  Old acutely ill Neuro:  Eyes upward gaze pupils 6 mm sluggish not moving any of her extremities  HEENT:  Intubated  Cardiovascular: normal heart sounds no murmrurs Lungs:  Clear no wheezing no crackles  Abdomen:  Soft no tenderness no organomegaly  Musculoskeletal:  No edema  Skin:  No rash   LABS:  BMET Recent Labs  Lab 10/11/2017 1605  NA 136  K 3.5  CL 100*  CO2 22  BUN 16  CREATININE 0.79  GLUCOSE 345*    Electrolytes Recent Labs  Lab 10/27/2017 1605  CALCIUM 8.8*  MG 1.8    CBC Recent Labs  Lab 10/31/2017 1605  WBC 17.7*  HGB 13.6  HCT 40.4  PLT 222    Coag's No results for input(s): APTT, INR in the last 168 hours.  Sepsis Markers No  results for input(s): LATICACIDVEN, PROCALCITON, O2SATVEN in the last 168 hours.  ABG Recent Labs  Lab 10/09/2017 1634  PHART 7.371  PCO2ART 39.7  PO2ART 410.0*    Liver Enzymes Recent Labs  Lab 10/18/2017 1605  AST 58*  ALT 23  ALKPHOS 37*  BILITOT 1.1  ALBUMIN 3.7    Cardiac Enzymes Recent Labs  Lab 10/15/2017 1605  TROPONINI 0.68*    Glucose No results for input(s): GLUCAP in the last 168 hours.  Imaging Ct Head Wo Contrast  Result Date: 10/31/2017 CLINICAL DATA:  84 y/o  F; found unresponsive. EXAM: CT HEAD WITHOUT CONTRAST TECHNIQUE: Contiguous axial images were obtained from the base of the skull through the vertex without intravenous contrast. COMPARISON:  None. FINDINGS: Brain: There multiple small foci of acute brain parenchymal hemorrhage within the dorsal vermis, left superior cerebellar hemisphere, and left superior frontal lobe without significant mass effect. There is a large parenchymal hemorrhage within the left frontal lobe measuring 8.2 x 4.7 x 5.4 cm (volume = 110 cm^3) series 3, image 17 and series 5, image 22. Mass effect associated with the large left frontal hematoma results in 5 mm of left-to-right midline shift. Additionally, there is a small volume of subarachnoid hemorrhage over the left frontal convexity no additional areas of subarachnoid hemorrhage over the right frontal and parietal lobes as well as over the cerebellum. Streak artifact from the cochlear implant partially obscures the midportion of the brain. Vascular: Calcific atherosclerosis of carotid siphons. Skull: Normal. Negative for fracture or focal lesion. Sinuses/Orbits: Right wall up mastoidectomy with small amount of fluid in the mastoid ectomy pole and in the right mastoid tip. Cochlear implant on the right in situ. Normal aeration of paranasal sinuses. Small right sphenoid sinus fluid level. Other: None. IMPRESSION: 1. Large left frontal lobe hemorrhage measuring up to 8.2 cm, 110 cc.  Associated edema and mass effect with 5 mm left-to-right midline shift. 2. Multiple additional small foci of brain parenchymal hemorrhage within dorsal vermis, left superior cerebellum, and left superior frontal lobe without significant mass effect. 3. Small volume subarachnoid hemorrhage predominantly over left frontal convexity with additional areas over the right frontal and parietal lobes and cerebellum. Critical Value/emergent results were called by telephone at the time of interpretation on 11/05/2017 at 5:24 pm to Dr. Raeford Razor , who verbally acknowledged these results. Electronically Signed   By: Mitzi Hansen M.D.   On: 10/18/2017 17:26   Dg Chest Portable 1 View  Result Date: 10/11/2017 CLINICAL DATA:  Intubated. EXAM: PORTABLE CHEST 1 VIEW COMPARISON:  07/09/2015. FINDINGS: Normal sized heart. Interval endotracheal tube  in satisfactory position. Interval nasogastric tube with its tip in the proximal stomach and side hole at the gastroesophageal junction. Decreased inspiration with mild right basilar atelectasis. The interstitial markings remain minimally prominent. Right shoulder degenerative changes and bursal calcifications. Interval left shoulder prosthesis. Marked thoracolumbar scoliosis without significant change. IMPRESSION: 1. Endotracheal tube in satisfactory position and nasogastric tube tip at the gastroesophageal junction. 2. Decreased inspiration with mild right basilar atelectasis and possible pneumonia. 3. Stable minimal chronic interstitial lung disease. Electronically Signed   By: Beckie Salts M.D.   On: 10/30/2017 15:40     ASSESSMENT / PLAN:  Acute Intracerebral hemorrhage  Acute encephalopathy secondary to massive ICH Acute respiratory insufficiency requiring mechanical ventilation for airway protection   Plan: - vent protocols - keep SBP <140 - CTA brain ordered by neurology - appreciate neuro follow up - NSx said no interventions and patient has very  poor prognosis - ED physician discussed goals of care with family and they verbalized understanding but waiting on other failly members to come from [facility] - admit to ICU  - SSI  - palliative care consult will be beneficial  - use precedex for sedation if needed   FAMILY  - Updates: none at bedside    - Inter-disciplinary family meet or Palliative Care meeting due by: 11/07/2017   Pulmonary and Critical Care Medicine [facility] Pager: [phone]  10/26/2017, 6:47 PM

## 2017-11-06 NOTE — ED Notes (Signed)
PT back from CT- no complications

## 2017-11-07 ENCOUNTER — Inpatient Hospital Stay (HOSPITAL_COMMUNITY): Payer: Medicare Other

## 2017-11-07 DIAGNOSIS — J9600 Acute respiratory failure, unspecified whether with hypoxia or hypercapnia: Secondary | ICD-10-CM

## 2017-11-07 DIAGNOSIS — Z7189 Other specified counseling: Secondary | ICD-10-CM

## 2017-11-07 DIAGNOSIS — Z515 Encounter for palliative care: Secondary | ICD-10-CM

## 2017-11-07 DIAGNOSIS — I611 Nontraumatic intracerebral hemorrhage in hemisphere, cortical: Principal | ICD-10-CM

## 2017-11-07 LAB — CBC
HCT: 40.4 % (ref 36.0–46.0)
HEMOGLOBIN: 13.5 g/dL (ref 12.0–15.0)
MCH: 29.3 pg (ref 26.0–34.0)
MCHC: 33.4 g/dL (ref 30.0–36.0)
MCV: 87.6 fL (ref 78.0–100.0)
Platelets: 194 10*3/uL (ref 150–400)
RBC: 4.61 MIL/uL (ref 3.87–5.11)
RDW: 14.3 % (ref 11.5–15.5)
WBC: 19.4 10*3/uL — ABNORMAL HIGH (ref 4.0–10.5)

## 2017-11-07 LAB — BASIC METABOLIC PANEL
Anion gap: 14 (ref 5–15)
BUN: 16 mg/dL (ref 6–20)
CALCIUM: 8.7 mg/dL — AB (ref 8.9–10.3)
CHLORIDE: 105 mmol/L (ref 101–111)
CO2: 20 mmol/L — AB (ref 22–32)
CREATININE: 0.59 mg/dL (ref 0.44–1.00)
GFR calc non Af Amer: >60 mL/min (ref >60)
Glucose, Bld: 235 mg/dL — ABNORMAL HIGH (ref 65–99)
Potassium: 3.7 mmol/L (ref 3.5–5.1)
SODIUM: 139 mmol/L (ref 135–145)

## 2017-11-07 LAB — GLUCOSE, CAPILLARY
GLUCOSE-CAPILLARY: 183 mg/dL — AB (ref 65–99)
GLUCOSE-CAPILLARY: 106 mg/dL — AB (ref 65–99)
Glucose-Capillary: 159 mg/dL — ABNORMAL HIGH (ref 65–99)
Glucose-Capillary: 128 mg/dL — ABNORMAL HIGH (ref 65–99)
Glucose-Capillary: 168 mg/dL — ABNORMAL HIGH (ref 65–99)

## 2017-11-07 LAB — MRSA PCR SCREENING: MRSA by PCR: NEGATIVE

## 2017-11-07 LAB — URINE CULTURE: Culture: NO GROWTH

## 2017-11-07 LAB — BRAIN NATRIURETIC PEPTIDE: B Natriuretic Peptide: 319.9 pg/mL — ABNORMAL HIGH (ref 0.0–100.0)

## 2017-11-07 LAB — CBG MONITORING, ED: Glucose-Capillary: 277 mg/dL — ABNORMAL HIGH (ref 65–99)

## 2017-11-07 IMAGING — DX DG CHEST 1V PORT
1 series · 1 of 1 positions shown · non-contrast
Comparison: Radiograph November 06, 2017.

CLINICAL DATA: Respiratory failure, status post intubation.

EXAM:
PORTABLE CHEST 1 VIEW

[chest]
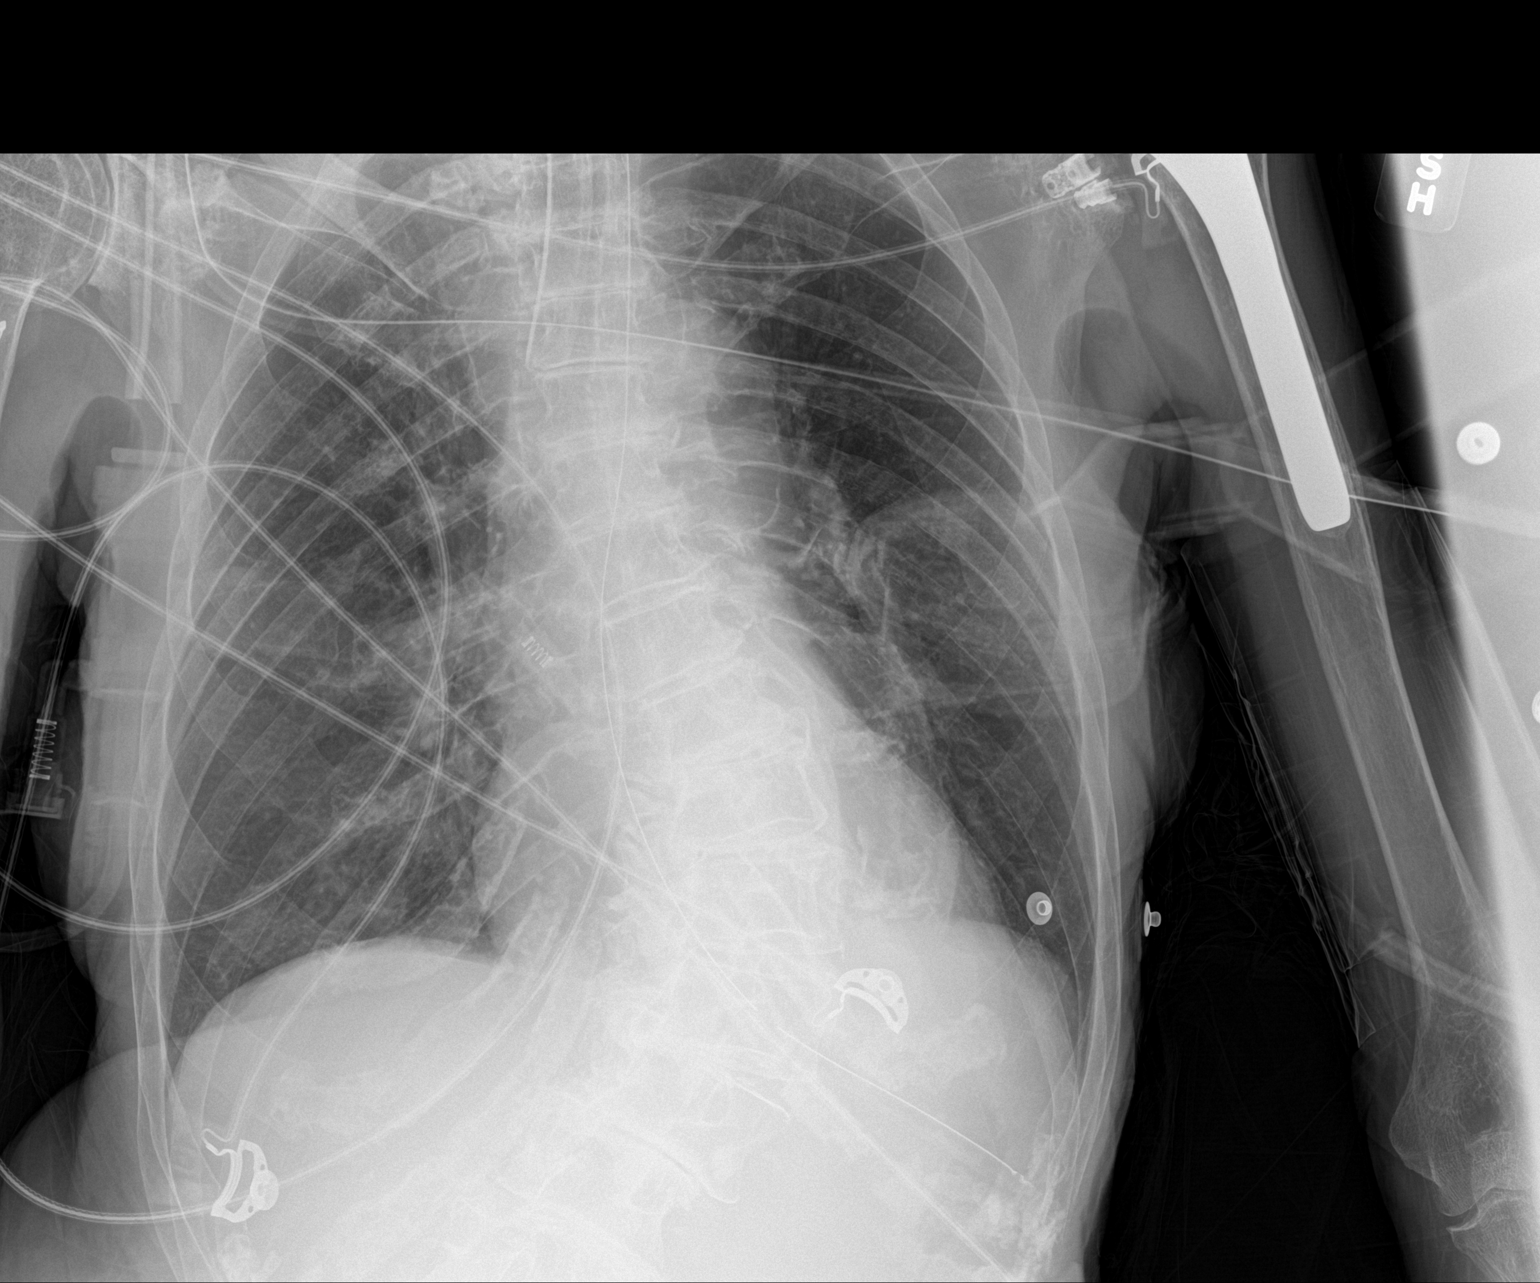

[1 of 1 positions shown; findings below may reference images not displayed]

FINDINGS: Stable cardiomediastinal silhouette. Endotracheal and nasogastric
tubes are unchanged in position. Minimal bibasilar subsegmental
atelectasis is noted. Bony thorax is unremarkable.
IMPRESSION: Stable support apparatus. Minimal bibasilar subsegmental
atelectasis.

## 2017-11-07 MED ORDER — CHLORHEXIDINE GLUCONATE 0.12% ORAL RINSE (MEDLINE KIT)
15.0000 mL | Freq: Two times a day (BID) | OROMUCOSAL | Status: DC
  Administered 2017-11-07 – 2017-11-09 (×5): 15 mL via OROMUCOSAL

## 2017-11-07 MED ORDER — ORAL CARE MOUTH RINSE
15.0000 mL | OROMUCOSAL | Status: DC
  Administered 2017-11-07 – 2017-11-09 (×22): 15 mL via OROMUCOSAL

## 2017-11-07 MED ORDER — SODIUM CHLORIDE 0.9 % IV BOLUS
1000.0000 mL | Freq: Once | INTRAVENOUS | Status: AC
  Administered 2017-11-07: 1000 mL via INTRAVENOUS

## 2017-11-07 NOTE — Progress Notes (Signed)
STROKE TEAM PROGRESS NOTE  Alexis Tran is a 84 y.o. female with PMH of deafness, bilateral cataracts, who was brought in when she was noticed to be found unresponsive at the facility per chart notes. She had not been seen for 24 hrs prior. On EMS arrival, she was a GCS 3 and was intubated in ER. NCCT head showed a large left frontal ICH and she was admitted to ICU. Neurology was consulted for management of ICH. No family currently at bedside but EDP has been in communication with family (POA who lives in [place]). Currently waiting family arrival from out of state for further code status/management decisions. Pending that, she is currently full code and scope of treatment from what I can tell from chart review. Presumably neurosurgery has been consulted over the phone and offered no surgical intervention at this time.   LKW: Unknown tpa given?: no, not an ischemic stroke-is an ICH Premorbid modified Rankin scale (mRS): Unclear but per some verbal report at least 3-4 ICH score- 4   INTERVAL HISTORY Her RN is at the bedside.  She remains unresponsive and was intubated. Blood pressure is running low this morning during rounds and was given 500 mL normal saline bolus.family is on their way from [place]  Vitals:   11/07/17 0435 11/07/17 0500 11/07/17 0600 11/07/17 0700  BP: (!) 103/57 (!) 107/59 (!) 106/56 124/69  Pulse: (!) 124 (!) 105 97 100  Resp: Temp:  (!) 101.5 F (38.6 C) (!) 101.3 F (38.5 C) (!) 100.4 F (38 C)  TempSrc:      SpO2: 100% 99% 100% 100%  Weight:      Height:        CBC:  CBC Latest Ref Rng & Units 11/07/2017 10/20/2017  WBC 4.0 - 10.5 K/uL 19.4(H) 17.7(H)  Hemoglobin 12.0 - 15.0 g/dL 16.1 09.6  Hematocrit 04.5 - 46.0 % 40.4 40.4  Platelets 150 - 400 K/uL 194 222     Comprehensive Metabolic Panel:   CMP Latest Ref Rng & Units 11/07/2017 10/29/2017  Glucose 65 - 99 mg/dL 409(W) 119(J)  BUN 6 - 20 mg/dL 16 16  Creatinine 4.78 - 1.00 mg/dL  2.95 6.21  Sodium 308 - 145 mmol/L 139 136  Potassium 3.5 - 5.1 mmol/L 3.7 3.5  Chloride 101 - 111 mmol/L 105 100(L)  CO2 22 - 32 mmol/L 20(L) 22  Calcium 8.9 - 10.3 mg/dL 6.5(H) 8.4(O)  Total Protein 6.5 - 8.1 g/dL - 6.1(L)  Total Bilirubin 0.3 - 1.2 mg/dL - 1.1  Alkaline Phos 38 - 126 U/L - 37(L)  AST 15 - 41 U/L - 58(H)  ALT 14 - 54 U/L - 23   Lipid Panel: No results found for: CHOL, TRIG, HDL, CHOLHDL, VLDL, LDLCALC HgbA1c: No results found for: HGBA1C Urine Drug Screen: No results found for: LABOPIA, COCAINSCRNUR, LABBENZ, AMPHETMU, THCU, LABBARB  Alcohol Level No results found for: ETH  IMAGING Ct Angio Head W Or Wo Contrast  Result Date: 10/30/2017 CLINICAL DATA:  Follow-up intracranial hemorrhage. History of diabetes. EXAM: CT ANGIOGRAPHY HEAD AND NECK TECHNIQUE: Multidetector CT imaging of the head and neck was performed using the standard protocol during bolus administration of intravenous contrast. Multiplanar CT image reconstructions and MIPs were obtained to evaluate the vascular anatomy. Carotid stenosis measurements (when applicable) are obtained utilizing NASCET criteria, using the distal internal carotid diameter as the denominator. CONTRAST:  50mL ISOVUE-370 IOPAMIDOL (ISOVUE-370) INJECTION 76% COMPARISON:  CT HEAD November 06, 2017  FINDINGS: CTA NECK FINDINGS: AORTIC ARCH: Normal appearance of the thoracic arch, normal branch pattern. Mild intimal thickening calcific atherosclerosis the origins of the innominate, left Common carotid artery and subclavian artery are widely patent. RIGHT CAROTID SYSTEM: Common carotid artery is patent. Normal appearance of the carotid bifurcation without hemodynamically significant stenosis by NASCET criteria. Patent internal carotid artery with mild luminal irregularity most compatible with atherosclerosis. LEFT CAROTID SYSTEM: Common carotid artery is patent though partially obscured by venous contamination streak artifact. Normal appearance of  the carotid bifurcation without hemodynamically significant stenosis by NASCET criteria. Patent internal carotid artery with mild luminal irregularity most compatible with atherosclerosis. VERTEBRAL ARTERIES:Left vertebral artery is dominant. Mild luminal irregularity of the vertebral arteries compatible with atherosclerosis. No dissection. SKELETON: No acute osseous process though bone windows have not been submitted. Multiple loose bodies RIGHT shoulder. Status post LEFT shoulder arthroplasty. Severe degenerative change of the cervical spine. OTHER NECK: Soft tissues of the neck are nonacute though, not tailored for evaluation. Multiple calcified thyroid nodules. UPPER CHEST: Biapical pleuroparenchymal scarring. Life-support lines in place. CTA HEAD FINDINGS: ANTERIOR CIRCULATION: Patent cervical internal carotid arteries, petrous, cavernous and supra clinoid internal carotid arteries, dolichoectasia seen with chronic hypertension. 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 Patent anterior communicating artery. Patent anterior and middle cerebral arteries. No large vessel occlusion, significant stenosis, contrast extravasation or aneurysm. POSTERIOR CIRCULATION: Patent vertebral arteries, vertebrobasilar junction and basilar artery, as well as main branch vessels. Dolichoectatic posterior circulation seen with chronic hypertension. Patent posterior cerebral arteries. No large vessel occlusion, significant stenosis, contrast extravasation or aneurysm. VENOUS SINUSES: Major dural venous sinuses are patent though not tailored for evaluation on this angiographic examination. ANATOMIC VARIANTS: None. DELAYED PHASE: No abnormal intracranial enhancement. Multiple scalp hematomas. MIP images reviewed. IMPRESSION: CTA NECK: 1. No hemodynamically significant stenosis or acute vascular process in the neck. CTA HEAD: 1. No emergent large vessel occlusion or flow limiting stenosis. 2. 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 3. Multiple scalp hematomas. Electronically Signed   By: Awilda Metro M.D.   On: 10/22/2017 19:40   Ct Head Wo Contrast  Addendum Date: 10/23/2017   ADDENDUM REPORT: 11/02/2017 19:41 ADDENDUM: Scalp soft tissue thickening at the vertex of the skull extending to the right lateral frontal region compatible with contusion. Electronically Signed   By: Mitzi Hansen M.D.   On: 10/31/2017 19:41   Result Date: 10/23/2017 CLINICAL DATA:  84 y/o  F; found unresponsive. EXAM: CT HEAD WITHOUT CONTRAST TECHNIQUE: Contiguous axial images were obtained from the base of the skull through the vertex without intravenous contrast. COMPARISON:  None. FINDINGS: Brain: There multiple small foci of acute brain parenchymal hemorrhage within the dorsal vermis, left superior cerebellar hemisphere, and left superior frontal lobe without significant mass effect. There is a large parenchymal hemorrhage within the left frontal lobe measuring 8.2 x 4.7 x 5.4 cm (volume = 110 cm^3) series 3, image 17 and series 5, image 22. Mass effect associated with the large left frontal hematoma results in 5 mm of left-to-right midline shift. Additionally, there is a small volume of subarachnoid hemorrhage over the left frontal convexity no additional areas of subarachnoid hemorrhage over the right frontal and parietal lobes as well as over the cerebellum. Streak artifact from the cochlear implant partially obscures the midportion of the brain. Vascular: Calcific atherosclerosis of carotid siphons. Skull: Normal. Negative for fracture or focal lesion. Sinuses/Orbits: Right wall up mastoidectomy with small amount of fluid in the mastoid ectomy pole  and in the right mastoid tip. Cochlear implant on the right in situ. Normal aeration of paranasal sinuses. Small right sphenoid sinus fluid level. Other: None. IMPRESSION: 1. Large left frontal lobe hemorrhage measuring up to 8.2 cm, 110 cc. Associated edema and mass effect with 5 mm  left-to-right midline shift. 2. Multiple additional small foci of brain parenchymal hemorrhage within dorsal vermis, left superior cerebellum, and left superior frontal lobe without significant mass effect. 3. Small volume subarachnoid hemorrhage predominantly over left frontal convexity with additional areas over the right frontal and parietal lobes and cerebellum. Critical Value/emergent results were called by telephone at the time of interpretation on 10/10/2017 at 5:24 pm to Dr. Raeford Razor , who verbally acknowledged these results. Electronically Signed: By: Mitzi Hansen M.D. On: 10/20/2017 17:26   Ct Angio Neck W And/or Wo Contrast  Result Date: 10/13/2017 CLINICAL DATA:  Follow-up intracranial hemorrhage. History of diabetes. EXAM: CT ANGIOGRAPHY HEAD AND NECK TECHNIQUE: Multidetector CT imaging of the head and neck was performed using the standard protocol during bolus administration of intravenous contrast. Multiplanar CT image reconstructions and MIPs were obtained to evaluate the vascular anatomy. Carotid stenosis measurements (when applicable) are obtained utilizing NASCET criteria, using the distal internal carotid diameter as the denominator. CONTRAST:  50mL ISOVUE-370 IOPAMIDOL (ISOVUE-370) INJECTION 76% COMPARISON:  CT HEAD November 06, 2017 FINDINGS: CTA NECK FINDINGS: AORTIC ARCH: Normal appearance of the thoracic arch, normal branch pattern. Mild intimal thickening calcific atherosclerosis the origins of the innominate, left Common carotid artery and subclavian artery are widely patent. RIGHT CAROTID SYSTEM: Common carotid artery is patent. Normal appearance of the carotid bifurcation without hemodynamically significant stenosis by NASCET criteria. Patent internal carotid artery with mild luminal irregularity most compatible with atherosclerosis. LEFT CAROTID SYSTEM: Common carotid artery is patent though partially obscured by venous contamination streak artifact. Normal appearance  of the carotid bifurcation without hemodynamically significant stenosis by NASCET criteria. Patent internal carotid artery with mild luminal irregularity most compatible with atherosclerosis. VERTEBRAL ARTERIES:Left vertebral artery is dominant. Mild luminal irregularity of the vertebral arteries compatible with atherosclerosis. No dissection. SKELETON: No acute osseous process though bone windows have not been submitted. Multiple loose bodies RIGHT shoulder. Status post LEFT shoulder arthroplasty. Severe degenerative change of the cervical spine. OTHER NECK: Soft tissues of the neck are nonacute though, not tailored for evaluation. Multiple calcified thyroid nodules. UPPER CHEST: Biapical pleuroparenchymal scarring. Life-support lines in place. CTA HEAD FINDINGS: ANTERIOR CIRCULATION: Patent cervical internal carotid arteries, petrous, cavernous and supra clinoid internal carotid arteries, dolichoectasia seen with chronic hypertension. 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 Patent anterior communicating artery. Patent anterior and middle cerebral arteries. No large vessel occlusion, significant stenosis, contrast extravasation or aneurysm. POSTERIOR CIRCULATION: Patent vertebral arteries, vertebrobasilar junction and basilar artery, as well as main branch vessels. Dolichoectatic posterior circulation seen with chronic hypertension. Patent posterior cerebral arteries. No large vessel occlusion, significant stenosis, contrast extravasation or aneurysm. VENOUS SINUSES: Major dural venous sinuses are patent though not tailored for evaluation on this angiographic examination. ANATOMIC VARIANTS: None. DELAYED PHASE: No abnormal intracranial enhancement. Multiple scalp hematomas. MIP images reviewed. IMPRESSION: CTA NECK: 1. No hemodynamically significant stenosis or acute vascular process in the neck. CTA HEAD: 1. No emergent large vessel occlusion or flow limiting stenosis. 2. 3 mm intact RIGHT carotid  terminus aneurysm. 3. Multiple scalp hematomas. Electronically Signed   By: Awilda Metro M.D.   On: 10/15/2017 19:40   Dg Chest Portable 1 View  Result Date: 10/16/2017  CLINICAL DATA:  Intubated. EXAM: PORTABLE CHEST 1 VIEW COMPARISON:  07/09/2015. FINDINGS: Normal sized heart. Interval endotracheal tube in satisfactory position. Interval nasogastric tube with its tip in the proximal stomach and side hole at the gastroesophageal junction. Decreased inspiration with mild right basilar atelectasis. The interstitial markings remain minimally prominent. Right shoulder degenerative changes and bursal calcifications. Interval left shoulder prosthesis. Marked thoracolumbar scoliosis without significant change. IMPRESSION: 1. Endotracheal tube in satisfactory position and nasogastric tube tip at the gastroesophageal junction. 2. Decreased inspiration with mild right basilar atelectasis and possible pneumonia. 3. Stable minimal chronic interstitial lung disease. Electronically Signed   By: Beckie Salts M.D.   On: 11/01/2017 15:40    PHYSICAL EXAM Frail cachectic malnourished-looking elderly Caucasian lady whose intubated. . Afebrile. Head is nontraumatic. Neck is supple without bruit.    Cardiac exam no murmur or gallop. Lungs are clear to auscultation. Distal pulses are well felt. Neurological Exam :  Patient is unresponsive. Eyes are closed. She has minimum response to sternal rub with partial opening of the eyes but does not follow any commands. His mild skew eye deviation with left eye deviated slightly up and outwards. Pupils 2-3 mm irregular sluggishly reactive. Corneal reflexes are present. She does have a cough and gag. There is some respiratory effort above ventilator setting. She does withdraw left upper and lower extremity semi-purposefully to painful stimuli. There is no movement in the right upper extremity. This trace withdrawal in the right lower extremity. ASSESSMENT/PLAN Alexis Tran  is a 84 y.o. female with history of deafness, bilateral cataracts found unresponsive in her facility presenting unresponsive, intubated in the ED.     Stroke:   Large left frontal intracerebral hemorrhage likely secondary to amyloid angiopathy    NS consult Venetia Maxon) no role for surgical intervention, palliative care recommended  CT head large left frontal hemorrhage 110 cc with associated edema and mass-effect with 5 mm left to right shift.  Additional small foci of brain hemorrhage within dorsal vermis, left superior cerebellum and left superior frontal lobe.  Small subarachnoid hemorrhage left frontal convexity and additional subarachnoid hemorrhage of the right frontal and parietal lobes and cerebellum  CTA head no ELVO.  3 mm R carotid terminus aneurysm.  Multiple scalp hematomas   CTA neck no significant stenosis   MRI  / MRA  Has cochlear implant  SCDs for VTE prophylaxis  NPO  No antithrombotic prior to admission  Disposition:    (from ALF)  Respiratory failure, ventilated, secondary to intracerebral hemorrhage   Central fever central fever secondary to intracerebral hemorrhage  Hypertensive emergency  Blood pressure as high as 183/99 and 180/122 on arrival  Systolic blood pressure goal less than 140 in acute hemorrhage    Hyperlipidemia  Home meds: Lipitor 10  Diabetes type II  Other Stroke Risk Factors  Advanced age  Other Active Problems  Deaf w/ cochlea implant  Legally blind  Hospital day # 1  I have personally examined this patient, reviewed notes, independently viewed imaging studies, participated in medical decision making and plan of care.ROS completed by me personally and pertinent positives fully documented  I have made any additions or clarifications directly to the above note. Agree with note above. She presented with fall and unresponsiveness due to large left frontal parenchymal intracerebral hemorrhage etiology indeterminate but possibly  amyloid angiopathy given advanced age. Her neurological exam and prognosis is quite poor. Patient's family is en route and need to discuss CODE STATUS and palliative care as  of survival and making meaningful recovery and having good quality of life are quite minimum.Recommend strict control of blood pressure and close neurological monitoring. This patient is critically ill and at significant risk of neurological worsening, death and care requires constant monitoring of vital signs, hemodynamics,respiratory and cardiac monitoring, extensive review of multiple databases, frequent neurological assessment, discussion with family, other specialists and medical decision making of high complexity.I have made any additions or clarifications directly to the above note.This critical care time does not reflect procedure time, or teaching time or supervisory time of PA/NP/Med Resident etc but could involve care discussion time.  I spent 35 minutes of neurocritical care time  in the care of  this patient.     Delia Heady, MD Medical Director [facility] Pager: [phone] 11/07/2017 1:24 PM   To contact Stroke Continuity provider, please refer to [email or web address]. After hours, contact General Neurology

## 2017-11-07 NOTE — Consult Note (Signed)
Consultation Note Date: 11/07/2017   Patient Name: Alexis Tran  DOB: 04/19/1933  MRN: [id number]  Age / Sex: 84 y.o., female  PCP: Patient, No Pcp Per Referring Physician: Aldean Jewett, MD  Reason for Consultation: Establishing goals of care and Terminal Care  HPI/Patient Profile: 84 y.o. female  with past medical history of deafness, cataracts, DM2, HTN, osteoarthritis, admitted on 10/13/2017 with being found unresponsive at her living facility. Workup reveals severe ICH with 5-6 mm left to right midline shift. She required intubation and was not a candidate for surgical intervention. Palliative medicine consulted for "EOL planning".    Clinical Assessment and Goals of Care: Patient evaluated- was nonresponsive to voice or touch.  Sister- Sunday Spillers, who is patient's HCPOA was at bedside.  Brief life review was conducted. Patient enjoyed living at [facility]- she was artistic and organized yoga classes. 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  I answered Sylvia's questions regarding her sister's current state and prognosis. Continued aggressive medical care vs comfort care was explained.  Sunday Spillers produced advanced directives for [employer] would not want prolonged intubation in a terminal state. Additionally, she would not want artificial hydration or nutrition.  We discussed that if Sunday Spillers survives this she will likely not return to her independent state of living. Sunday Spillers notes that Blondine would not choose to live that way.  Sunday Spillers would like time to discuss her sister's state with Fatima's 20 and brothers.  She is in agreement with DNR status for [facility] and believes this aligns with Alazae's advanced directives. Sunday Spillers understands this means that if despite all care that is being done now, [place] were to experience death and have no pulse and not  be breathing, there would not be further efforts at resuscitation.   Primary Decision Maker HCPOA - patient's sister- Sunday Spillers    SUMMARY OF RECOMMENDATIONS -DNR -Continue current level of care for now -Sunday Spillers will discuss withdrawal of care and transition to full comfort with her brothers after visit from physicians -PMT will continue to follow and discuss plan of care with Sunday Spillers and assist as needed    Code Status/Advance Care Planning:  DNR  Prognosis:    Unable to determine  Discharge Planning: To Be Determined  Primary Diagnoses: Present on Admission: . ICH (intracerebral hemorrhage) ([place])   I have reviewed the medical record, interviewed the patient and family, and examined the patient. The following aspects are pertinent.  Past Medical History:  Diagnosis Date  . Cataract of both eyes   . Deaf   . Diabetes mellitus without complication ([place])   . Hemochromatosis    Social History   Socioeconomic History  . Marital status: Single    Spouse name: Not on file  . Number of children: Not on file  . Years of education: Not on file  . Highest education level: Not on file  Occupational History  . Not on file  Social Needs  . Financial resource strain: Not on file  . Food insecurity:  Worry: Not on file    Inability: Not on file  . Transportation needs:    Medical: Not on file    Non-medical: Not on file  Tobacco Use  . Smoking status: Never Smoker  . Smokeless tobacco: Never Used  Substance and Sexual Activity  . Alcohol use: No  . Drug use: No  . Sexual activity: Not on file  Lifestyle  . Physical activity:    Days per week: Not on file    Minutes per session: Not on file  . Stress: Not on file  Relationships  . Social connections:    Talks on phone: Not on file    Gets together: Not on file    Attends religious service: Not on file    Active member of club or organization: Not on file    Attends meetings of clubs or organizations: Not on file      Relationship status: Not on file  Other Topics Concern  . Not on file  Social History Narrative  . Not on file   No family history on file. Scheduled Meds: .  stroke: mapping our early stages of recovery book   Does not apply Once  . chlorhexidine gluconate (MEDLINE KIT)  15 mL Mouth Rinse BID  . insulin aspart  1-3 Units Subcutaneous Q4H  . labetalol  10 mg Intravenous Once  . losartan  50 mg Oral Daily  . mouth rinse  15 mL Mouth Rinse 10 times per day  . senna-docusate  1 tablet Oral BID   Continuous Infusions: . sodium chloride Stopped (10/31/2017 1953)  . sodium chloride    . sodium chloride 50 mL/hr at 11/07/17 1400  . clevidipine    . dexmedetomidine (PRECEDEX) IV infusion Stopped (10/11/2017 2059)  . famotidine (PEPCID) IV Stopped (11/07/17 0940)   PRN Meds:.sodium chloride, acetaminophen **OR** acetaminophen (TYLENOL) oral liquid 160 mg/5 mL **OR** acetaminophen Medications Prior to Admission:  Prior to Admission medications   Medication Sig Start Date End Date Taking? Authorizing Provider  alendronate (FOSAMAX) 70 MG tablet Take 70 mg by mouth once a week. Take with a full glass of water on an empty stomach.   Yes [provider]  ampicillin (PRINCIPEN) 500 MG capsule Take 2,000 mg by mouth See admin instructions. 1 hour prior to Dental Appointment   Yes [provider]  glimepiride (AMARYL) 2 MG tablet Take 2 mg by mouth daily with breakfast.   Yes [provider]  ipratropium (ATROVENT) 0.06 % nasal spray Place 2 sprays into both nostrils 3 (three) times daily.   Yes [provider]  losartan (COZAAR) 50 MG tablet Take 50 mg by mouth daily.   Yes [provider]  metFORMIN (GLUCOPHAGE-XR) 500 MG 24 hr tablet Take 500 mg by mouth 2 (two) times daily.   Yes [provider]  montelukast (SINGULAIR) 10 MG tablet Take 10 mg by mouth at bedtime.   Yes [provider]  prednisoLONE acetate (PRED FORTE) 1 %  ophthalmic suspension Place 1 drop into the left eye 2 (two) times daily.   Yes [provider]  simvastatin (ZOCOR) 10 MG tablet Take 10 mg by mouth daily.   Yes [provider]  travoprost, benzalkonium, (TRAVATAN) 0.004 % ophthalmic solution Place 1 drop into the right eye daily.   Yes [provider]  celecoxib (CELEBREX) 200 MG capsule Take 1 capsule (200 mg total) by mouth daily. Patient not taking: Reported on 11/05/2017 05/04/16   Suzan Slick,  NP  diclofenac sodium (VOLTAREN) 1 % GEL Apply 4 g topically 4 (four) times daily.    [provider]  methimazole (TAPAZOLE) 5 MG tablet Take 5 mg by mouth daily.    [provider]   No Known Allergies Review of Systems  Unable to perform ROS: Patient unresponsive    Physical Exam  Constitutional:  frail  Cardiovascular:  tachycardic  Pulmonary/Chest:  intubated  Neurological:  unresponsive  Nursing note and vitals reviewed.   Vital Signs: BP 116/62   Pulse (!) 102   Temp (!) 101.1 F (38.4 C)   Resp 16   Ht _0  (1.626 m)   Wt 39.4 kg (86 lb 13.8 oz)   SpO2 100%   BMI 14.91 kg/m  Pain Scale: CPOT       SpO2: SpO2: 100 % O2 Device:SpO2: 100 % O2 Flow Rate: .   IO: Intake/output summary:   Intake/Output Summary (Last 24 hours) at 11/07/2017 1631 Last data filed at 11/07/2017 1400 Gross per 24 hour  Intake 2516.94 ml  Output 755 ml  Net 1761.94 ml    LBM:   Baseline Weight: Weight: 50 kg (110 lb 3.7 oz) Most recent weight: Weight: 39.4 kg (86 lb 13.8 oz)     Palliative Assessment/Data: PPS: 10%     Thank you for this consult. Palliative medicine will continue to follow and assist as needed.   Time In: 1500 Time Out: 1430 Time Total: 90 mins Greater than 50%  of this time was spent counseling and coordinating care related to the above assessment and plan.  Signed by: Mariana Kaufman, AGNP-C Palliative Medicine    Please contact Palliative Medicine Team phone  at [phone] for questions and concerns.  For individual provider: See Shea Evans

## 2017-11-07 NOTE — Progress Notes (Signed)
SLP Cancellation Note  Patient Details Name: Alexis Tran MRN: [id number] DOB: [birth year]   Cancelled treatment:        Pt on vent. Will continue efforts.    Royce Macadamia 11/07/2017, 8:53 AM

## 2017-11-07 NOTE — Progress Notes (Signed)
Pt transferred to 2H on ventilator. No events to report.

## 2017-11-07 NOTE — Progress Notes (Signed)
Initial Nutrition Assessment  DOCUMENTATION CODES:   Underweight, Severe malnutrition in context of social or environmental circumstances  INTERVENTION:    Monitor for goals of care  Will provide tube feeding recommendations if warranted  NUTRITION DIAGNOSIS:   Severe Malnutrition related to social / environmental circumstances as evidenced by percent weight loss, severe fat depletion, severe muscle depletion.  GOAL:   Patient will meet greater than or equal to 90% of their needs  MONITOR:   PO intake, Supplement acceptance, Weight trends, Labs  REASON FOR ASSESSMENT:   Ventilator    ASSESSMENT:   Patient with PMH significant for deafness, HTN, and DM. Presents this admission with altered mental status. Found to have acute intracerebral hemorrhage and acute respiratory insufficiency requiring intubation.    Patient was assessed by neurosurgery and there is no intervention to be done and has a very poor prognosis. Palliative to meet with family today.   Sister from [place] at bedside. Unsure if pt had any recent wt loss. States she stopped eating as much a couple of years ago and her PCP told the family to "let her eat when she wants to eat." All family is from out of town, unsure of what pt's day to day intake looks like.   Unable to obtain wt history from sister. Records indicate pt weighed 92 lb on 09/13/17 (at [facility]) and 86 lb this admission (6.5% wt loss in one month, significant for time frame). Nutrition-Focused physical exam completed.  Patient is currently intubated on ventilator support MV: 8 L/min Temp (24hrs), Avg:101.9 F (38.8 C), Min:98.2 F (36.8 C), Max:103.1 F (39.5 C) BP: 134/76 MAP: 92  I/O: +1761 ml since admit UOP: 555 ml x 24 hours  Medications reviewed and include: SSI Labs reviewed: CBG 235 (H)   NUTRITION - FOCUSED PHYSICAL EXAM:    Most Recent Value  Orbital Region  Moderate depletion  Upper Arm Region  Severe depletion  Thoracic and  Lumbar Region  Unable to assess  Buccal Region  Severe depletion  Temple Region  Severe depletion  Clavicle Bone Region  Severe depletion  Clavicle and Acromion Bone Region  Severe depletion  Scapular Bone Region  Unable to assess  Dorsal Hand  Moderate depletion  Patellar Region  Severe depletion  Anterior Thigh Region  Severe depletion  Posterior Calf Region  Severe depletion  Edema (RD Assessment)  None     Diet Order:   Diet Order           Diet NPO time specified  Diet effective now          EDUCATION NEEDS:   Not appropriate for education at this time  Skin:  Skin Assessment: Skin Integrity Issues: Skin Integrity Issues:: Other (Comment) Other: non pressure sacrum  Last BM:  PTA  Height:   Ht Readings from Last 1 Encounters:  10/22/2017  (1.626 m)    Weight:   Wt Readings from Last 1 Encounters:  11/07/17 86 lb 13.8 oz (39.4 kg)    Ideal Body Weight:  54.5 kg  BMI:  Body mass index is 14.91 kg/m.  Estimated Nutritional Needs:   Kcal:  1000-1200 kcal  Protein:  65-75 g  Fluid:  > L/day    Vanessa Kick RD, LDN Clinical Nutrition Pager # [phone]

## 2017-11-07 NOTE — Progress Notes (Signed)
eLink Physician-Brief Progress Note Patient Name: Alexis Tran DOB: 13-Sep-1932 MRN: [id number]   Date of Service  11/07/2017  HPI/Events of Note  Oliguria   eICU Interventions  Will order: 1. Bolus with 0.9 NaCl 1 liter IV over 1 hour now.      Intervention Category Intermediate Interventions: Oliguria - evaluation and management  Lenaya Pietsch Eugene 11/07/2017, 5:17 AM

## 2017-11-07 NOTE — Progress Notes (Signed)
OT Cancellation Note  Patient Details Name: Alexis Tran MRN: [id number] DOB: 05/16/1933   Cancelled Treatment:    Reason Eval/Treat Not Completed: Patient not medically ready- remains unresponsive on ventilator. Please re-order for OT evaluation when pt medically appropriate to participate with therapies.    [name] 11/07/2017, 8:26 AM  Sherryl Manges OTR/L [phone]

## 2017-11-07 NOTE — Progress Notes (Signed)
PULMONARY / CRITICAL CARE MEDICINE   Name: Alexis Tran MRN: [id number] DOB: Jun 19, 1933    ADMISSION DATE:  11/03/2017 CONSULTATION DATE:  10/27/2017   REFERRING MD:  ED CHIEF COMPLAINT:  Altered mental status   HISTORY OF PRESENT ILLNESS:   84 yr old lady with known HTN lives in an assisted living was brought in with altered mental status. Patient was not seen for around 24hrs and when was seen she had a GCS of 3 and was intubated in the ED. CT showed massive left frontal ICH with other areas of smaller bleeds.   Patient was assessed by neurosurgery and there is no intervention to be done and has a very poor prognosis. Neurology recommended CT angio.   I came to assess the patient in the ED and obviously she is not able to give any history on the vent and not responsive. No family around.   Subjective: Poor prognosis as expressed by neurosurgical consult.  Care has been consulted on 11/07/2017.  She currently remains a full code.    VITAL SIGNS: BP 119/65   Pulse (!) 109   Temp (!) 100.8 F (38.2 C)   Resp (!) 0   Ht  (1.626 m)   Wt 39.4 kg (86 lb 13.8 oz)   SpO2 100%   BMI 14.91 kg/m   HEMODYNAMICS:    VENTILATOR SETTINGS: Vent Mode: PRVC FiO2 (%):  [40 %-100 %] 40 % Set Rate:  [14 bmp] 14 bmp Vt Set:  [440 mL] 440 mL PEEP:  [5 cmH20] 5 cmH20 Plateau Pressure:  [8 cmH20-20 cmH20] 16 cmH20  INTAKE / OUTPUT: I/O last 3 completed shifts: In: 2116.9 [I.V.:766.9; IV Piggyback:1350] Out: 555 [Urine:555]  PHYSICAL EXAMINATION: General: Frail elderly female currently sedated on propofol HEENT: Endotracheal tube event, disconjugate gaze with a left upper gaze preference pupils equal reactive PSY: None available Neuro: Withdraws left arm to noxious stimuli CV: Heart sounds are regular PULM: Breath sounds in the bases GI: Abdomen soft nontender Extremities: Warm dry with vascular changes noted in lower extremities Skin: no rashes or  lesions   LABS:  BMET Recent Labs  Lab 11/05/2017 1605 11/07/17 0319  NA 136 139  K 3.5 3.7  CL 100* 105  CO2 22 20*  BUN 16 16  CREATININE 0.79 0.59  GLUCOSE 345* 235*    Electrolytes Recent Labs  Lab 10/24/2017 1605 11/07/17 0319  CALCIUM 8.8* 8.7*  MG 1.8  --     CBC Recent Labs  Lab 10/17/2017 1605 11/07/17 0319  WBC 17.7* 19.4*  HGB 13.6 13.5  HCT 40.4 40.4  PLT 222 194    Coag's Recent Labs  Lab 10/23/2017 1952  INR 1.24    Sepsis Markers Recent Labs  Lab 10/23/2017 1632 10/21/2017 1952  LATICACIDVEN 4.5* 2.4*    ABG Recent Labs  Lab 11/03/2017 1634  PHART 7.371  PCO2ART 39.7  PO2ART 410.0*    Liver Enzymes Recent Labs  Lab 10/10/2017 1605  AST 58*  ALT 23  ALKPHOS 37*  BILITOT 1.1  ALBUMIN 3.7    Cardiac Enzymes Recent Labs  Lab 10/21/2017 1605  TROPONINI 0.68*    Glucose Recent Labs  Lab 10/21/2017 1955 11/07/17 0001 11/07/17 0402 11/07/17 0729  GLUCAP 261* 277* 183* 159*    Imaging Ct Angio Head W Or Wo Contrast  Result Date: 11/05/2017 CLINICAL DATA:  Follow-up intracranial hemorrhage. History of diabetes. EXAM: CT ANGIOGRAPHY HEAD AND NECK TECHNIQUE: Multidetector CT imaging of the head and neck  was performed using the standard protocol during bolus administration of intravenous contrast. Multiplanar CT image reconstructions and MIPs were obtained to evaluate the vascular anatomy. Carotid stenosis measurements (when applicable) are obtained utilizing NASCET criteria, using the distal internal carotid diameter as the denominator. CONTRAST:  50mL ISOVUE-370 IOPAMIDOL (ISOVUE-370) INJECTION 76% COMPARISON:  CT HEAD November 06, 2017 FINDINGS: CTA NECK FINDINGS: AORTIC ARCH: Normal appearance of the thoracic arch, normal branch pattern. Mild intimal thickening calcific atherosclerosis the origins of the innominate, left Common carotid artery and subclavian artery are widely patent. RIGHT CAROTID SYSTEM: Common carotid artery is patent.  Normal appearance of the carotid bifurcation without hemodynamically significant stenosis by NASCET criteria. Patent internal carotid artery with mild luminal irregularity most compatible with atherosclerosis. LEFT CAROTID SYSTEM: Common carotid artery is patent though partially obscured by venous contamination streak artifact. Normal appearance of the carotid bifurcation without hemodynamically significant stenosis by NASCET criteria. Patent internal carotid artery with mild luminal irregularity most compatible with atherosclerosis. VERTEBRAL ARTERIES:Left vertebral artery is dominant. Mild luminal irregularity of the vertebral arteries compatible with atherosclerosis. No dissection. SKELETON: No acute osseous process though bone windows have not been submitted. Multiple loose bodies RIGHT shoulder. Status post LEFT shoulder arthroplasty. Severe degenerative change of the cervical spine. OTHER NECK: Soft tissues of the neck are nonacute though, not tailored for evaluation. Multiple calcified thyroid nodules. UPPER CHEST: Biapical pleuroparenchymal scarring. Life-support lines in place. CTA HEAD FINDINGS: ANTERIOR CIRCULATION: Patent cervical internal carotid arteries, petrous, cavernous and supra clinoid internal carotid arteries, dolichoectasia seen with chronic hypertension. 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 Patent anterior communicating artery. Patent anterior and middle cerebral arteries. No large vessel occlusion, significant stenosis, contrast extravasation or aneurysm. POSTERIOR CIRCULATION: Patent vertebral arteries, vertebrobasilar junction and basilar artery, as well as main branch vessels. Dolichoectatic posterior circulation seen with chronic hypertension. Patent posterior cerebral arteries. No large vessel occlusion, significant stenosis, contrast extravasation or aneurysm. VENOUS SINUSES: Major dural venous sinuses are patent though not tailored for evaluation on this angiographic  examination. ANATOMIC VARIANTS: None. DELAYED PHASE: No abnormal intracranial enhancement. Multiple scalp hematomas. MIP images reviewed. IMPRESSION: CTA NECK: 1. No hemodynamically significant stenosis or acute vascular process in the neck. CTA HEAD: 1. No emergent large vessel occlusion or flow limiting stenosis. 2. 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 3. Multiple scalp hematomas. Electronically Signed   By: Awilda Metro M.D.   On: 10/21/2017 19:40   Ct Head Wo Contrast  Addendum Date: 10/23/2017   ADDENDUM REPORT: 10/15/2017 19:41 ADDENDUM: Scalp soft tissue thickening at the vertex of the skull extending to the right lateral frontal region compatible with contusion. Electronically Signed   By: Mitzi Hansen M.D.   On: 11/01/2017 19:41   Result Date: 11/02/2017 CLINICAL DATA:  84 y/o  F; found unresponsive. EXAM: CT HEAD WITHOUT CONTRAST TECHNIQUE: Contiguous axial images were obtained from the base of the skull through the vertex without intravenous contrast. COMPARISON:  None. FINDINGS: Brain: There multiple small foci of acute brain parenchymal hemorrhage within the dorsal vermis, left superior cerebellar hemisphere, and left superior frontal lobe without significant mass effect. There is a large parenchymal hemorrhage within the left frontal lobe measuring 8.2 x 4.7 x 5.4 cm (volume = 110 cm^3) series 3, image 17 and series 5, image 22. Mass effect associated with the large left frontal hematoma results in 5 mm of left-to-right midline shift. Additionally, there is a small volume of subarachnoid hemorrhage over the left frontal convexity no additional  areas of subarachnoid hemorrhage over the right frontal and parietal lobes as well as over the cerebellum. Streak artifact from the cochlear implant partially obscures the midportion of the brain. Vascular: Calcific atherosclerosis of carotid siphons. Skull: Normal. Negative for fracture or focal lesion. Sinuses/Orbits: Right wall  up mastoidectomy with small amount of fluid in the mastoid ectomy pole and in the right mastoid tip. Cochlear implant on the right in situ. Normal aeration of paranasal sinuses. Small right sphenoid sinus fluid level. Other: None. IMPRESSION: 1. Large left frontal lobe hemorrhage measuring up to 8.2 cm, 110 cc. Associated edema and mass effect with 5 mm left-to-right midline shift. 2. Multiple additional small foci of brain parenchymal hemorrhage within dorsal vermis, left superior cerebellum, and left superior frontal lobe without significant mass effect. 3. Small volume subarachnoid hemorrhage predominantly over left frontal convexity with additional areas over the right frontal and parietal lobes and cerebellum. Critical Value/emergent results were called by telephone at the time of interpretation on Nov 29, 2017 at 5:24 pm to Dr. Raeford Razor , who verbally acknowledged these results. Electronically Signed: By: Mitzi Hansen M.D. On: 11-29-2017 17:26   Ct Angio Neck W And/or Wo Contrast  Result Date: 11-29-2017 CLINICAL DATA:  Follow-up intracranial hemorrhage. History of diabetes. EXAM: CT ANGIOGRAPHY HEAD AND NECK TECHNIQUE: Multidetector CT imaging of the head and neck was performed using the standard protocol during bolus administration of intravenous contrast. Multiplanar CT image reconstructions and MIPs were obtained to evaluate the vascular anatomy. Carotid stenosis measurements (when applicable) are obtained utilizing NASCET criteria, using the distal internal carotid diameter as the denominator. CONTRAST:  50mL ISOVUE-370 IOPAMIDOL (ISOVUE-370) INJECTION 76% COMPARISON:  CT HEAD 11/29/17 FINDINGS: CTA NECK FINDINGS: AORTIC ARCH: Normal appearance of the thoracic arch, normal branch pattern. Mild intimal thickening calcific atherosclerosis the origins of the innominate, left Common carotid artery and subclavian artery are widely patent. RIGHT CAROTID SYSTEM: Common carotid artery is  patent. Normal appearance of the carotid bifurcation without hemodynamically significant stenosis by NASCET criteria. Patent internal carotid artery with mild luminal irregularity most compatible with atherosclerosis. LEFT CAROTID SYSTEM: Common carotid artery is patent though partially obscured by venous contamination streak artifact. Normal appearance of the carotid bifurcation without hemodynamically significant stenosis by NASCET criteria. Patent internal carotid artery with mild luminal irregularity most compatible with atherosclerosis. VERTEBRAL ARTERIES:Left vertebral artery is dominant. Mild luminal irregularity of the vertebral arteries compatible with atherosclerosis. No dissection. SKELETON: No acute osseous process though bone windows have not been submitted. Multiple loose bodies RIGHT shoulder. Status post LEFT shoulder arthroplasty. Severe degenerative change of the cervical spine. OTHER NECK: Soft tissues of the neck are nonacute though, not tailored for evaluation. Multiple calcified thyroid nodules. UPPER CHEST: Biapical pleuroparenchymal scarring. Life-support lines in place. CTA HEAD FINDINGS: ANTERIOR CIRCULATION: Patent cervical internal carotid arteries, petrous, cavernous and supra clinoid internal carotid arteries, dolichoectasia seen with chronic hypertension. 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 Patent anterior communicating artery. Patent anterior and middle cerebral arteries. No large vessel occlusion, significant stenosis, contrast extravasation or aneurysm. POSTERIOR CIRCULATION: Patent vertebral arteries, vertebrobasilar junction and basilar artery, as well as main branch vessels. Dolichoectatic posterior circulation seen with chronic hypertension. Patent posterior cerebral arteries. No large vessel occlusion, significant stenosis, contrast extravasation or aneurysm. VENOUS SINUSES: Major dural venous sinuses are patent though not tailored for evaluation on this  angiographic examination. ANATOMIC VARIANTS: None. DELAYED PHASE: No abnormal intracranial enhancement. Multiple scalp hematomas. MIP images reviewed. IMPRESSION: CTA NECK: 1. No  hemodynamically significant stenosis or acute vascular process in the neck. CTA HEAD: 1. No emergent large vessel occlusion or flow limiting stenosis. 2. 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 3. Multiple scalp hematomas. Electronically Signed   By: Awilda Metro M.D.   On: 10/24/2017 19:40   Dg Chest Portable 1 View  Result Date: 10/12/2017 CLINICAL DATA:  Intubated. EXAM: PORTABLE CHEST 1 VIEW COMPARISON:  07/09/2015. FINDINGS: Normal sized heart. Interval endotracheal tube in satisfactory position. Interval nasogastric tube with its tip in the proximal stomach and side hole at the gastroesophageal junction. Decreased inspiration with mild right basilar atelectasis. The interstitial markings remain minimally prominent. Right shoulder degenerative changes and bursal calcifications. Interval left shoulder prosthesis. Marked thoracolumbar scoliosis without significant change. IMPRESSION: 1. Endotracheal tube in satisfactory position and nasogastric tube tip at the gastroesophageal junction. 2. Decreased inspiration with mild right basilar atelectasis and possible pneumonia. 3. Stable minimal chronic interstitial lung disease. Electronically Signed   By: Beckie Salts M.D.   On: 10/20/2017 15:40     ASSESSMENT / PLAN:  Acute Intracerebral hemorrhage  Acute encephalopathy secondary to massive ICH Acute respiratory insufficiency requiring mechanical ventilation for airway protection   Plan: Vent bundle Keep systolic blood pressure less than 140 She cannot have an MRI due to cochlear implant. Palliative care consult placed on 11/07/2017 Continue sedation as needed Await family members to arrive from [place]. Continue sliding scale insulin  FAMILY  - Updates: none at bedside    - Inter-disciplinary family  meet or Palliative Care meeting due by: 11/07/2017   Brett Canales Calla Wedekind ACNP [facility] Pager [phone] till 1 pm If no answer page [phone] 11/07/2017, 8:21 AM

## 2017-11-07 NOTE — Progress Notes (Signed)
PT Cancellation Note  Patient Details Name: Alexis Tran MRN: [id number] DOB: 24-Feb-1933   Cancelled Treatment:    Reason Eval/Treat Not Completed: Patient not medically ready - remains unresponsive on ventilator. Please re-order for PT evaluation when pt medically appropriate to participate with therapies.  Ina Homes, PT, DPT Acute Rehab Services  Pager: [phone]  Malachy Chamber 11/07/2017, 7:53 AM

## 2017-11-07 DEATH — deceased

## 2017-11-08 ENCOUNTER — Inpatient Hospital Stay (HOSPITAL_COMMUNITY): Payer: Medicare Other

## 2017-11-08 DIAGNOSIS — E43 Unspecified severe protein-calorie malnutrition: Secondary | ICD-10-CM

## 2017-11-08 DIAGNOSIS — Z7189 Other specified counseling: Secondary | ICD-10-CM

## 2017-11-08 LAB — CBC WITH DIFFERENTIAL/PLATELET
BASOS PCT: 0 %
Basophils Absolute: 0 10*3/uL (ref 0.0–0.1)
Eosinophils Absolute: 0 10*3/uL (ref 0.0–0.7)
Eosinophils Relative: 0 %
HCT: 36.8 % (ref 36.0–46.0)
HEMOGLOBIN: 12.2 g/dL (ref 12.0–15.0)
LYMPHS ABS: 1.1 10*3/uL (ref 0.7–4.0)
LYMPHS PCT: 7 %
MCH: 29.2 pg (ref 26.0–34.0)
MCHC: 33.2 g/dL (ref 30.0–36.0)
MCV: 88 fL (ref 78.0–100.0)
MONO ABS: 1.7 10*3/uL — AB (ref 0.1–1.0)
MONOS PCT: 11 %
NEUTROS PCT: 82 %
Neutro Abs: 13.2 10*3/uL — ABNORMAL HIGH (ref 1.7–7.7)
Platelets: 158 10*3/uL (ref 150–400)
RBC: 4.18 MIL/uL (ref 3.87–5.11)
RDW: 14.3 % (ref 11.5–15.5)
WBC: 16 10*3/uL — ABNORMAL HIGH (ref 4.0–10.5)

## 2017-11-08 LAB — MAGNESIUM: Magnesium: 2 mg/dL (ref 1.7–2.4)

## 2017-11-08 LAB — BASIC METABOLIC PANEL
Anion gap: 10 (ref 5–15)
BUN: 15 mg/dL (ref 6–20)
CHLORIDE: 110 mmol/L (ref 101–111)
CO2: 20 mmol/L — AB (ref 22–32)
CREATININE: 0.44 mg/dL (ref 0.44–1.00)
Calcium: 8.2 mg/dL — ABNORMAL LOW (ref 8.9–10.3)
GFR calc Af Amer: >60 mL/min (ref >60)
GFR calc non Af Amer: >60 mL/min (ref >60)
GLUCOSE: 125 mg/dL — AB (ref 65–99)
POTASSIUM: 3.3 mmol/L — AB (ref 3.5–5.1)
SODIUM: 140 mmol/L (ref 135–145)

## 2017-11-08 LAB — GLUCOSE, CAPILLARY
GLUCOSE-CAPILLARY: 136 mg/dL — AB (ref 65–99)
GLUCOSE-CAPILLARY: 174 mg/dL — AB (ref 65–99)
Glucose-Capillary: 118 mg/dL — ABNORMAL HIGH (ref 65–99)
Glucose-Capillary: 126 mg/dL — ABNORMAL HIGH (ref 65–99)
Glucose-Capillary: 148 mg/dL — ABNORMAL HIGH (ref 65–99)
Glucose-Capillary: 168 mg/dL — ABNORMAL HIGH (ref 65–99)

## 2017-11-08 LAB — PHOSPHORUS: Phosphorus: 1.8 mg/dL — ABNORMAL LOW (ref 2.5–4.6)

## 2017-11-08 IMAGING — DX DG CHEST 1V PORT
1 series · 1 of 1 positions shown · non-contrast
Comparison: 11/07/2017

CLINICAL DATA: Respiratory failure.

EXAM:
PORTABLE CHEST 1 VIEW

[chest ap]
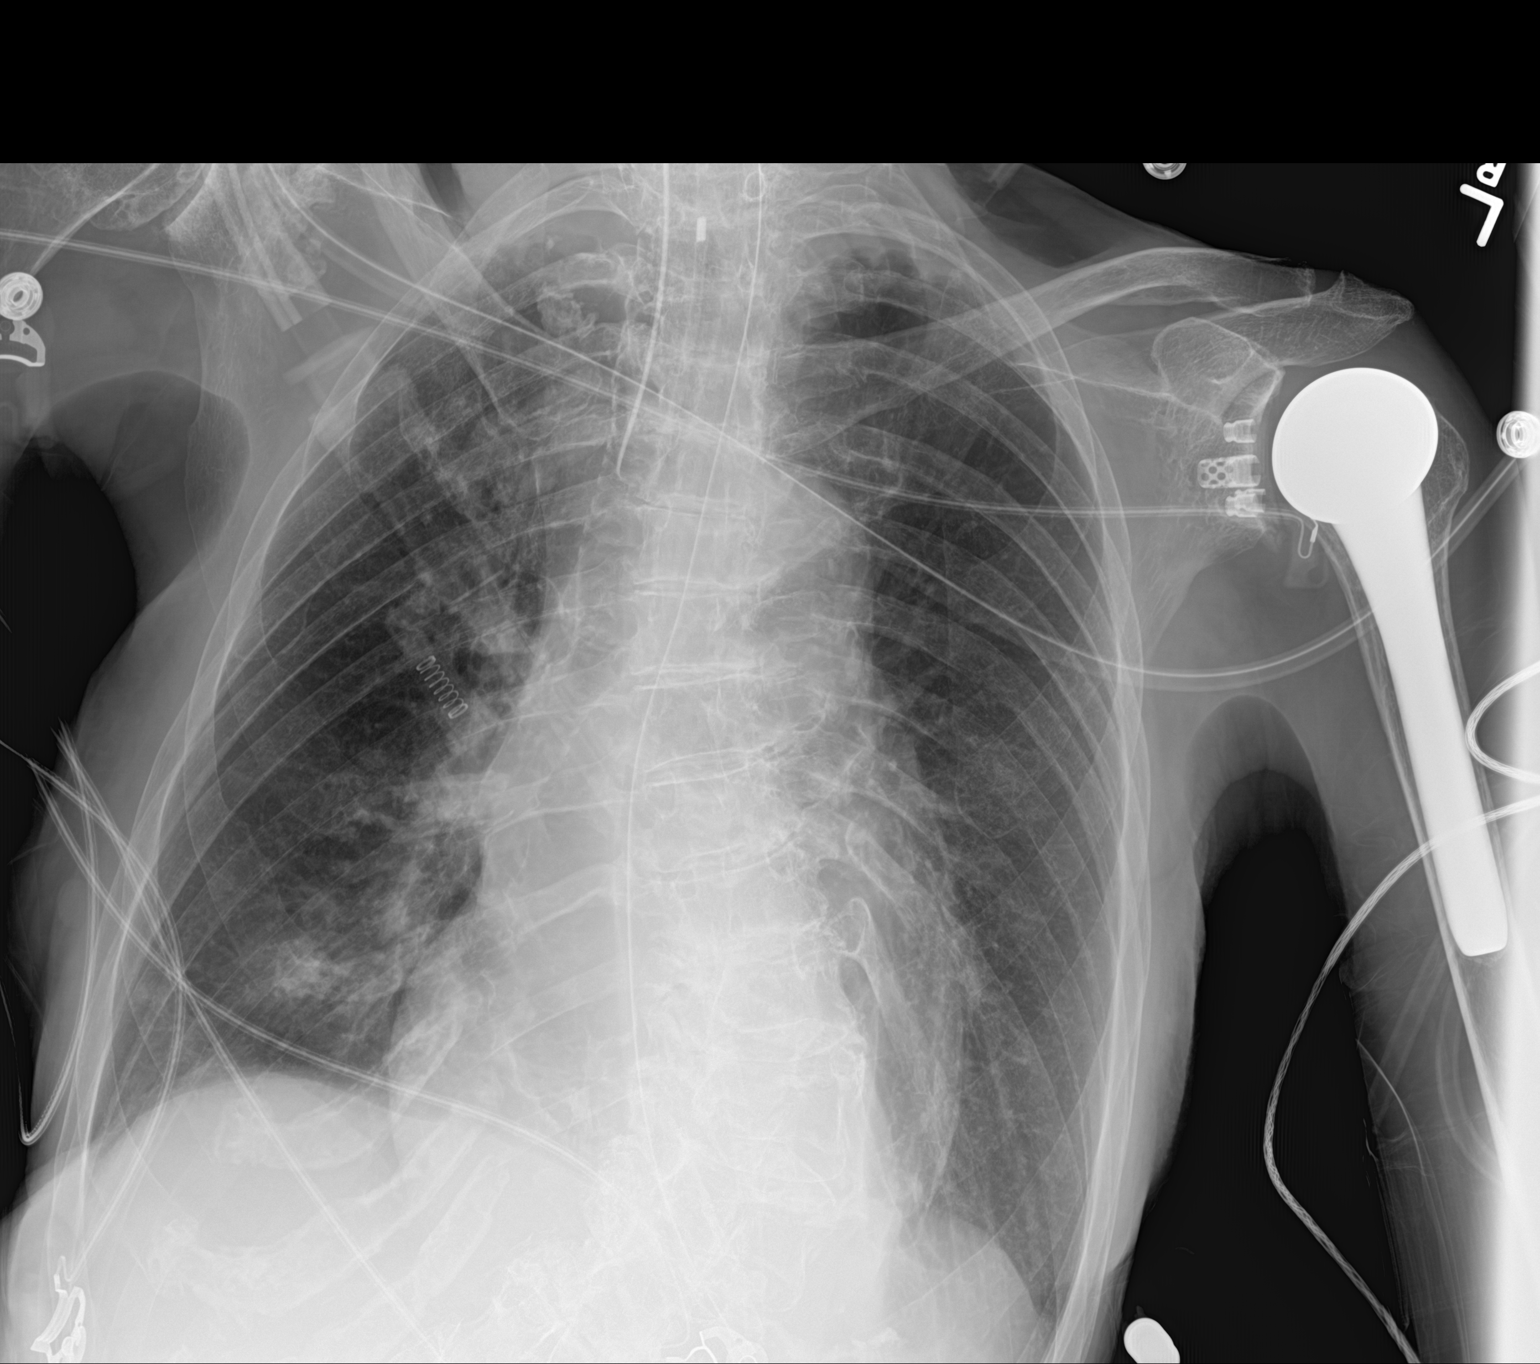

[1 of 1 positions shown; findings below may reference images not displayed]

FINDINGS: Endotracheal tube terminates 5 cm above the carina. Enteric tube
courses into the upper abdomen with tip not imaged. The
cardiomediastinal silhouette is unchanged allowing for increased
rightward patient rotation on today's study. Mild right basilar
opacity has slightly increased. There is minimal left basilar
atelectasis. No sizable pleural effusion or pneumothorax is
identified. Biapical pleural thickening is noted.
IMPRESSION: Increased, mild right basilar opacity, likely atelectasis.

## 2017-11-08 NOTE — Progress Notes (Signed)
PULMONARY / CRITICAL CARE MEDICINE   Name: Alexis Tran MRN: [id number] DOB: [birth year]    ADMISSION DATE:  2017/11/15 CONSULTATION DATE:  10/27/2017   REFERRING MD:  ED CHIEF COMPLAINT:  Altered mental status   HISTORY OF PRESENT ILLNESS:   84 yr old lady with known HTN lives in an assisted living was brought in with altered mental status. Patient was not seen for around 24hrs and when was seen she had a GCS of 3 and was intubated in the ED. CT showed massive left frontal ICH with other areas of smaller bleeds.   Patient was assessed by neurosurgery and there is no intervention to be done and has a very poor prognosis. Neurology recommended CT angio.   I came to assess the patient in the ED and obviously she is not able to give any history on the vent and not responsive. No family around.   Subjective: Remains on mechanical ventilation off sedation not following any commands not tracking     VITAL SIGNS: BP 135/62   Pulse 100   Temp 99.1 F (37.3 C)   Resp 13   Ht  (1.626 m)   Wt 41.5 kg (91 lb 7.9 oz)   SpO2 100%   BMI 15.70 kg/m   HEMODYNAMICS:    VENTILATOR SETTINGS: Vent Mode: PSV;CPAP FiO2 (%):  [40 %] 40 % Set Rate:  [14 bmp] 14 bmp Vt Set:  [440 mL] 440 mL PEEP:  [5 cmH20] 5 cmH20 Pressure Support:  [12 cmH20] 12 cmH20 Plateau Pressure:  [16 cmH20-19 cmH20] 17 cmH20  INTAKE / OUTPUT: I/O last 3 completed shifts: In: 3126.9 [I.V.:1666.9; Other:10; IV Piggyback:1450] Out: 1605 [Urine:1605]  PHYSICAL EXAMINATION: General: Frail elderly female  HEENT: Endotracheal tube , disconjugate gaze with a left upper gaze preference pupils equal reactive PSY: None available Neuro: Withdraws left arm to noxious stimuli decorticate on right  CV: Heart sounds are regular PULM: Breath sounds in the bases GI: Abdomen soft nontender Extremities: Warm dry with vascular changes noted in lower extremities Skin: no rashes or lesions   LABS:  BMET Recent Labs   Lab 11/15/2017 1605 11/07/17 0319 11/08/17 0425  NA 136 139 140  K 3.5 3.7 3.3*  CL 100* 105 110  CO2 22 20* 20*  BUN CREATININE 0.79 0.59 0.44  GLUCOSE 345* 235* 125*    Electrolytes Recent Labs  Lab 15-Nov-2017 1605 11/07/17 0319 11/08/17 0425  CALCIUM 8.8* 8.7* 8.2*  MG 1.8  --  2.0  PHOS  --   --  1.8*    CBC Recent Labs  Lab 11/15/2017 1605 11/07/17 0319 11/08/17 0425  WBC 17.7* 19.4* 16.0*  HGB 13.6 13.5 12.2  HCT 40.4 40.4 36.8  PLT 222 194 158    Coag's Recent Labs  Lab Nov 15, 2017 1952  INR 1.24    Sepsis Markers Recent Labs  Lab 11/15/2017 1632 15-Nov-2017 1952  LATICACIDVEN 4.5* 2.4*    ABG Recent Labs  Lab Nov 15, 2017 1634  PHART 7.371  PCO2ART 39.7  PO2ART 410.0*    Liver Enzymes Recent Labs  Lab 2017-11-15 1605  AST 58*  ALT 23  ALKPHOS 37*  BILITOT 1.1  ALBUMIN 3.7    Cardiac Enzymes Recent Labs  Lab 11-15-2017 1605  TROPONINI 0.68*    Glucose Recent Labs  Lab 11/07/17 1130 11/07/17 1627 11/07/17 2003 11/08/17 0004 11/08/17 0401 11/08/17 0721  GLUCAP 128* 168* 106* 126* 118* 136*    Imaging Dg Chest Port 1  View  Result Date: 11/08/2017 CLINICAL DATA:  Respiratory failure. EXAM: PORTABLE CHEST 1 VIEW COMPARISON:  11/07/2017 FINDINGS: Endotracheal tube terminates 5 cm above the carina. Enteric tube courses into the upper abdomen with tip not imaged. The cardiomediastinal silhouette is unchanged allowing for increased rightward patient rotation on today's study. Mild right basilar opacity has slightly increased. There is minimal left basilar atelectasis. No sizable pleural effusion or pneumothorax is identified. Biapical pleural thickening is noted. IMPRESSION: Increased, mild right basilar opacity, likely atelectasis. Electronically Signed   By: Sebastian Ache M.D.   On: 11/08/2017 09:14     ASSESSMENT / PLAN:  Acute Intracerebral hemorrhage  Acute encephalopathy secondary to massive ICH Acute respiratory  insufficiency requiring mechanical ventilation for airway protection   Plan: - we appreciate neuro follow up and their input to the family about prognosis  -We appreciate palliative care follow up and input. Waiting on patients family to meet and decide about goals of care and possible withdrawal  -Vent bundle -Keep systolic blood pressure less than 140 -She cannot have an MRI due to cochlear implant. -Continue sedation as needed -Await other family members to arrive  -Continue sliding scale insulin  FAMILY  - Updates: none at bedside    - Inter-disciplinary family meet or Palliative Care meeting due by: 11/07/2017

## 2017-11-08 NOTE — Progress Notes (Signed)
Rt AC IV infiltrated. IV removed, warm compress added, and extremity elevated. Fluid infiltrated was NS. 09

## 2017-11-08 NOTE — Progress Notes (Signed)
Daily Progress Note   Patient Name: Alexis Tran       Date: 11/08/2017 DOB: January 08, 1933  Age: 84 y.o. MRN#: [id number] Attending Physician: Aldean Jewett, MD Primary Care Physician: Patient, No Pcp Per Admit Date: 10/26/2017  Reason for Consultation/Follow-up: Establishing goals of care and Withdrawal of life-sustaining treatment  Subjective: Patient continues to have poor prognosis for meaningful recovery. Patient's sister at bedside. Her husband is coming from [place] tomorrow and will likely transition to full comfort with compassionate extubation then. Patient is catholic and sister requests [facility] for patient.   Review of Systems  Unable to perform ROS: Intubated    Length of Stay: 2  Current Medications: Scheduled Meds:  .  stroke: mapping our early stages of recovery book   Does not apply Once  . chlorhexidine gluconate (MEDLINE KIT)  15 mL Mouth Rinse BID  . insulin aspart  1-3 Units Subcutaneous Q4H  . labetalol  10 mg Intravenous Once  . losartan  50 mg Oral Daily  . mouth rinse  15 mL Mouth Rinse 10 times per day  . senna-docusate  1 tablet Oral BID    Continuous Infusions: . sodium chloride Stopped (10/09/2017 1953)  . sodium chloride    . sodium chloride 50 mL/hr at 11/08/17 1100  . clevidipine    . dexmedetomidine (PRECEDEX) IV infusion Stopped (11/01/2017 2059)  . famotidine (PEPCID) IV Stopped (11/08/17 1006)    PRN Meds: sodium chloride, acetaminophen **OR** acetaminophen (TYLENOL) oral liquid 160 mg/5 mL **OR** acetaminophen  Physical Exam  Constitutional:  frail  Cardiovascular:  tachycardic  Pulmonary/Chest:  intubated  Neurological:  Unresponsive, deaf  Skin: There is pallor.  Nursing note and vitals reviewed.           Vital Signs: BP  139/62   Pulse (!) 104   Temp 99.9 F (37.7 C)   Resp 14   Ht '5\' 4"'  (1.626 m)   Wt 41.5 kg (91 lb 7.9 oz)   SpO2 100%   BMI 15.70 kg/m  SpO2: SpO2: 100 % O2 Device: O2 Device: Ventilator O2 Flow Rate:    Intake/output summary:   Intake/Output Summary (Last 24 hours) at 11/08/2017 1142 Last data filed at 11/08/2017 1100 Gross per 24 hour  Intake 1310 ml  Output 1225 ml  Net 85  ml   LBM:   Baseline Weight: Weight: 50 kg (110 lb 3.7 oz) Most recent weight: Weight: 41.5 kg (91 lb 7.9 oz)       Palliative Assessment/Data: PPS: 10%    Flowsheet Rows     Most Recent Value  Intake Tab  Referral Department  Pulmonary  Palliative Care Primary Diagnosis  Neurology  Date Notified  11/07/17  Palliative Care Type  New Palliative care  Reason for referral  End of Life Care Assistance  Date of Admission  10/23/2017  Date first seen by Palliative Care  11/07/17  # of days Palliative referral response time  0 Day(s)  # of days IP prior to Palliative referral  1  Clinical Assessment  Psychosocial & Spiritual Assessment  Palliative Care Outcomes      Patient Active Problem List   Diagnosis Date Noted  . Protein-calorie malnutrition, severe 11/08/2017  . Acute respiratory failure ([place])   . Palliative care by specialist   . Advance care planning   . Terminal care   . ICH (intracerebral hemorrhage) ([place]) 10/15/2017  . Acute respiratory insufficiency     Palliative Care Assessment & Plan   Patient Profile: 84 y.o. female  with past medical history of deafness, cataracts, DM2, HTN, osteoarthritis, admitted on 10/20/2017 with being found unresponsive at her living facility. Workup reveals severe ICH with 5-6 mm left to right midline shift. She required intubation and was not a candidate for surgical intervention. Palliative medicine consulted for "EOL planning".    Assessment/Recommendations/Plan   Chaplain consult for [facility]  Will also contact [place] office for  possible use of [place] for patient's sister  Expect transition to comfort measures only tomorrow after Sherlynn Stalls husband arrives from [place] and Additional Recommendations:  Limitations on Scope of Treatment: No Artificial Feeding  Code Status:  DNR  Prognosis:   Unable to determine  Discharge Planning:  To Be Determined  Care plan was discussed with patient's sister and RN.  Thank you for allowing the Palliative Medicine Team to assist in the care of this patient.   Time In: 1100 Time Out: 1145 Total Time 45 mins Prolonged Time Billed no      Greater than 50%  of this time was spent counseling and coordinating care related to the above assessment and plan.  Mariana Kaufman, AGNP-C Palliative Medicine   Please contact Palliative Medicine Team phone at [phone] for questions and concerns.

## 2017-11-08 NOTE — Progress Notes (Signed)
STROKE TEAM PROGRESS NOTE     INTERVAL HISTORY Her RN and sister are at the bedside.  She remains unresponsive and was intubated. Blood pressure is  .adequately controlled  Vitals:   11/08/17 1200 11/08/17 1300 11/08/17 1400 11/08/17 1500  BP: 140/65 131/62 (!) 143/71 130/69  Pulse: (!) 101 (!) 104 (!) 110 (!) 105  Resp: 19 (!) Temp: (!) 100.4 F (38 C) (!) 100.4 F (38 C) (!) 100.6 F (38.1 C) (!) 100.4 F (38 C)  TempSrc:      SpO2: 100% 100% 100% 100%  Weight:      Height:        CBC:  CBC Latest Ref Rng & Units 11/08/2017 11/07/2017 10/21/2017  WBC 4.0 - 10.5 K/uL 16.0(H) 19.4(H) 17.7(H)  Hemoglobin 12.0 - 15.0 g/dL 16.1 09.6 04.5  Hematocrit 36.0 - 46.0 % 36.8 40.4 40.4  Platelets 150 - 400 K/uL 158 194 222     Comprehensive Metabolic Panel:   CMP Latest Ref Rng & Units 11/08/2017 11/07/2017 10/24/2017  Glucose 65 - 99 mg/dL 409(W) 119(J) 478(G)  BUN 6 - 20 mg/dL Creatinine 0.44 - 1.00 mg/dL 9.56 2.13 0.86  Sodium 135 - 145 mmol/L 140 139 136  Potassium 3.5 - 5.1 mmol/L 3.3(L) 3.7 3.5  Chloride 101 - 111 mmol/L 110 105 100(L)  CO2 22 - 32 mmol/L 20(L) 20(L) 22  Calcium 8.9 - 10.3 mg/dL 8.2(L) 8.7(L) 8.8(L)  Total Protein 6.5 - 8.1 g/dL - - 6.1(L)  Total Bilirubin 0.3 - 1.2 mg/dL - - 1.1  Alkaline Phos 38 - 126 U/L - - 37(L)  AST 15 - 41 U/L - - 58(H)  ALT 14 - 54 U/L - - 23   Lipid Panel: No results found for: CHOL, TRIG, HDL, CHOLHDL, VLDL, LDLCALC HgbA1c: No results found for: HGBA1C Urine Drug Screen: No results found for: LABOPIA, COCAINSCRNUR, LABBENZ, AMPHETMU, THCU, LABBARB  Alcohol Level No results found for: ETH  IMAGING Ct Angio Head W Or Wo Contrast  Result Date: 10/11/2017 CLINICAL DATA:  Follow-up intracranial hemorrhage. History of diabetes. EXAM: CT ANGIOGRAPHY HEAD AND NECK TECHNIQUE: Multidetector CT imaging of the head and neck was performed using the standard protocol during bolus administration of intravenous contrast.  Multiplanar CT image reconstructions and MIPs were obtained to evaluate the vascular anatomy. Carotid stenosis measurements (when applicable) are obtained utilizing NASCET criteria, using the distal internal carotid diameter as the denominator. CONTRAST:  50mL ISOVUE-370 IOPAMIDOL (ISOVUE-370) INJECTION 76% COMPARISON:  CT HEAD November 06, 2017 FINDINGS: CTA NECK FINDINGS: AORTIC ARCH: Normal appearance of the thoracic arch, normal branch pattern. Mild intimal thickening calcific atherosclerosis the origins of the innominate, left Common carotid artery and subclavian artery are widely patent. RIGHT CAROTID SYSTEM: Common carotid artery is patent. Normal appearance of the carotid bifurcation without hemodynamically significant stenosis by NASCET criteria. Patent internal carotid artery with mild luminal irregularity most compatible with atherosclerosis. LEFT CAROTID SYSTEM: Common carotid artery is patent though partially obscured by venous contamination streak artifact. Normal appearance of the carotid bifurcation without hemodynamically significant stenosis by NASCET criteria. Patent internal carotid artery with mild luminal irregularity most compatible with atherosclerosis. VERTEBRAL ARTERIES:Left vertebral artery is dominant. Mild luminal irregularity of the vertebral arteries compatible with atherosclerosis. No dissection. SKELETON: No acute osseous process though bone windows have not been submitted. Multiple loose bodies RIGHT shoulder. Status post LEFT shoulder arthroplasty. Severe degenerative change of the cervical spine. OTHER NECK: Soft tissues of  the neck are nonacute though, not tailored for evaluation. Multiple calcified thyroid nodules. UPPER CHEST: Biapical pleuroparenchymal scarring. Life-support lines in place. CTA HEAD FINDINGS: ANTERIOR CIRCULATION: Patent cervical internal carotid arteries, petrous, cavernous and supra clinoid internal carotid arteries, dolichoectasia seen with chronic  hypertension. 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 Patent anterior communicating artery. Patent anterior and middle cerebral arteries. No large vessel occlusion, significant stenosis, contrast extravasation or aneurysm. POSTERIOR CIRCULATION: Patent vertebral arteries, vertebrobasilar junction and basilar artery, as well as main branch vessels. Dolichoectatic posterior circulation seen with chronic hypertension. Patent posterior cerebral arteries. No large vessel occlusion, significant stenosis, contrast extravasation or aneurysm. VENOUS SINUSES: Major dural venous sinuses are patent though not tailored for evaluation on this angiographic examination. ANATOMIC VARIANTS: None. DELAYED PHASE: No abnormal intracranial enhancement. Multiple scalp hematomas. MIP images reviewed. IMPRESSION: CTA NECK: 1. No hemodynamically significant stenosis or acute vascular process in the neck. CTA HEAD: 1. No emergent large vessel occlusion or flow limiting stenosis. 2. 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 3. Multiple scalp hematomas. Electronically Signed   By: Awilda Metro M.D.   On: 11/01/2017 19:40   Ct Head Wo Contrast  Addendum Date: 10/30/2017   ADDENDUM REPORT: 10/22/2017 19:41 ADDENDUM: Scalp soft tissue thickening at the vertex of the skull extending to the right lateral frontal region compatible with contusion. Electronically Signed   By: Mitzi Hansen M.D.   On: 10/17/2017 19:41   Result Date: 11/04/2017 CLINICAL DATA:  84 y/o  F; found unresponsive. EXAM: CT HEAD WITHOUT CONTRAST TECHNIQUE: Contiguous axial images were obtained from the base of the skull through the vertex without intravenous contrast. COMPARISON:  None. FINDINGS: Brain: There multiple small foci of acute brain parenchymal hemorrhage within the dorsal vermis, left superior cerebellar hemisphere, and left superior frontal lobe without significant mass effect. There is a large parenchymal hemorrhage within the  left frontal lobe measuring 8.2 x 4.7 x 5.4 cm (volume = 110 cm^3) series 3, image 17 and series 5, image 22. Mass effect associated with the large left frontal hematoma results in 5 mm of left-to-right midline shift. Additionally, there is a small volume of subarachnoid hemorrhage over the left frontal convexity no additional areas of subarachnoid hemorrhage over the right frontal and parietal lobes as well as over the cerebellum. Streak artifact from the cochlear implant partially obscures the midportion of the brain. Vascular: Calcific atherosclerosis of carotid siphons. Skull: Normal. Negative for fracture or focal lesion. Sinuses/Orbits: Right wall up mastoidectomy with small amount of fluid in the mastoid ectomy pole and in the right mastoid tip. Cochlear implant on the right in situ. Normal aeration of paranasal sinuses. Small right sphenoid sinus fluid level. Other: None. IMPRESSION: 1. Large left frontal lobe hemorrhage measuring up to 8.2 cm, 110 cc. Associated edema and mass effect with 5 mm left-to-right midline shift. 2. Multiple additional small foci of brain parenchymal hemorrhage within dorsal vermis, left superior cerebellum, and left superior frontal lobe without significant mass effect. 3. Small volume subarachnoid hemorrhage predominantly over left frontal convexity with additional areas over the right frontal and parietal lobes and cerebellum. Critical Value/emergent results were called by telephone at the time of interpretation on 10/27/2017 at 5:24 pm to Dr. Raeford Razor , who verbally acknowledged these results. Electronically Signed: By: Mitzi Hansen M.D. On: 10/31/2017 17:26   Ct Angio Neck W And/or Wo Contrast  Result Date: 10/16/2017 CLINICAL DATA:  Follow-up intracranial hemorrhage. History of diabetes. EXAM: CT ANGIOGRAPHY HEAD AND  NECK TECHNIQUE: Multidetector CT imaging of the head and neck was performed using the standard protocol during bolus administration of  intravenous contrast. Multiplanar CT image reconstructions and MIPs were obtained to evaluate the vascular anatomy. Carotid stenosis measurements (when applicable) are obtained utilizing NASCET criteria, using the distal internal carotid diameter as the denominator. CONTRAST:  50mL ISOVUE-370 IOPAMIDOL (ISOVUE-370) INJECTION 76% COMPARISON:  CT HEAD 12/01/17 FINDINGS: CTA NECK FINDINGS: AORTIC ARCH: Normal appearance of the thoracic arch, normal branch pattern. Mild intimal thickening calcific atherosclerosis the origins of the innominate, left Common carotid artery and subclavian artery are widely patent. RIGHT CAROTID SYSTEM: Common carotid artery is patent. Normal appearance of the carotid bifurcation without hemodynamically significant stenosis by NASCET criteria. Patent internal carotid artery with mild luminal irregularity most compatible with atherosclerosis. LEFT CAROTID SYSTEM: Common carotid artery is patent though partially obscured by venous contamination streak artifact. Normal appearance of the carotid bifurcation without hemodynamically significant stenosis by NASCET criteria. Patent internal carotid artery with mild luminal irregularity most compatible with atherosclerosis. VERTEBRAL ARTERIES:Left vertebral artery is dominant. Mild luminal irregularity of the vertebral arteries compatible with atherosclerosis. No dissection. SKELETON: No acute osseous process though bone windows have not been submitted. Multiple loose bodies RIGHT shoulder. Status post LEFT shoulder arthroplasty. Severe degenerative change of the cervical spine. OTHER NECK: Soft tissues of the neck are nonacute though, not tailored for evaluation. Multiple calcified thyroid nodules. UPPER CHEST: Biapical pleuroparenchymal scarring. Life-support lines in place. CTA HEAD FINDINGS: ANTERIOR CIRCULATION: Patent cervical internal carotid arteries, petrous, cavernous and supra clinoid internal carotid arteries, dolichoectasia seen  with chronic hypertension. 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 Patent anterior communicating artery. Patent anterior and middle cerebral arteries. No large vessel occlusion, significant stenosis, contrast extravasation or aneurysm. POSTERIOR CIRCULATION: Patent vertebral arteries, vertebrobasilar junction and basilar artery, as well as main branch vessels. Dolichoectatic posterior circulation seen with chronic hypertension. Patent posterior cerebral arteries. No large vessel occlusion, significant stenosis, contrast extravasation or aneurysm. VENOUS SINUSES: Major dural venous sinuses are patent though not tailored for evaluation on this angiographic examination. ANATOMIC VARIANTS: None. DELAYED PHASE: No abnormal intracranial enhancement. Multiple scalp hematomas. MIP images reviewed. IMPRESSION: CTA NECK: 1. No hemodynamically significant stenosis or acute vascular process in the neck. CTA HEAD: 1. No emergent large vessel occlusion or flow limiting stenosis. 2. 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 3. Multiple scalp hematomas. Electronically Signed   By: Awilda Metro M.D.   On: 12-01-17 19:40   Dg Chest Port 1 View  Result Date: 11/08/2017 CLINICAL DATA:  Respiratory failure. EXAM: PORTABLE CHEST 1 VIEW COMPARISON:  11/07/2017 FINDINGS: Endotracheal tube terminates 5 cm above the carina. Enteric tube courses into the upper abdomen with tip not imaged. The cardiomediastinal silhouette is unchanged allowing for increased rightward patient rotation on today's study. Mild right basilar opacity has slightly increased. There is minimal left basilar atelectasis. No sizable pleural effusion or pneumothorax is identified. Biapical pleural thickening is noted. IMPRESSION: Increased, mild right basilar opacity, likely atelectasis. Electronically Signed   By: Sebastian Ache M.D.   On: 11/08/2017 09:14   Dg Chest Port 1 View  Result Date: 11/07/2017 CLINICAL DATA:  Respiratory failure, status  post intubation. EXAM: PORTABLE CHEST 1 VIEW COMPARISON:  Radiograph of 12-01-17. FINDINGS: Stable cardiomediastinal silhouette. Endotracheal and nasogastric tubes are unchanged in position. Minimal bibasilar subsegmental atelectasis is noted. Bony thorax is unremarkable. IMPRESSION: Stable support apparatus. Minimal bibasilar subsegmental atelectasis. Electronically Signed   By:  Lupita Raider, M.D.   On: 11/07/2017 10:25    PHYSICAL EXAM Frail cachectic malnourished-looking elderly Caucasian lady whose intubated. . Afebrile. Head is nontraumatic. Neck is supple without bruit.    Cardiac exam no murmur or gallop. Lungs are clear to auscultation. Distal pulses are well felt. Neurological Exam :  Patient is unresponsive. Eyes are closed. She has minimum response to sternal rub with partial opening of the eyes but does not follow any commands. His mild skew eye deviation with left eye deviated slightly up and outwards. Pupils 2-3 mm irregular sluggishly reactive. Corneal reflexes are present. She does have a cough and gag. There is some respiratory effort above ventilator setting. She does withdraw left upper and lower extremity semi-purposefully to painful stimuli. There is no movement in the right upper extremity. This trace withdrawal in the right lower extremity. ASSESSMENT/PLAN Ms. Kaneshia Cater is a 84 y.o. female with history of deafness, bilateral cataracts found unresponsive in her facility presenting unresponsive, intubated in the ED.     Stroke:   Large left frontal intracerebral hemorrhage likely secondary to amyloid angiopathy    NS consult Venetia Maxon) no role for surgical intervention, palliative care recommended  CT head large left frontal hemorrhage 110 cc with associated edema and mass-effect with 5 mm left to right shift.  Additional small foci of brain hemorrhage within dorsal vermis, left superior cerebellum and left superior frontal lobe.  Small subarachnoid hemorrhage left  frontal convexity and additional subarachnoid hemorrhage of the right frontal and parietal lobes and cerebellum  CTA head no ELVO.  3 mm R carotid terminus aneurysm.  Multiple scalp hematomas   CTA neck no significant stenosis   MRI  / MRA  Has cochlear implant  SCDs for VTE prophylaxis  NPO  No antithrombotic prior to admission  Disposition:    (from ALF)  Respiratory failure, ventilated, secondary to intracerebral hemorrhage   Central fever central fever secondary to intracerebral hemorrhage  Hypertensive emergency  Blood pressure as high as 183/99 and 180/122 on arrival  Systolic blood pressure goal less than 140 in acute hemorrhage    Hyperlipidemia  Home meds: Lipitor 10  Diabetes type II  Other Stroke Risk Factors  Advanced age  Other Active Problems  Deaf w/ cochlea implant  Legally blind  Hospital day # 2  I have personally examined this patient, reviewed notes, independently viewed imaging studies, participated in medical decision making and plan of care.ROS completed by me personally and pertinent positives fully documented  I have made any additions or clarifications directly to the above note  She presented with fall and unresponsiveness due to large left frontal parenchymal intracerebral hemorrhage etiology indeterminate but possibly amyloid angiopathy given advanced age. Her neurological exam and prognosis is quite poor. I had a long discussion at the bedside with the patient's sister about her poor prognosis and answered questions. She is quite clear that patient would not want prolonged ventilatory support, tracheostomy, PEG tube and going to nursing home. She agrees to DO NOT RESUSCITATE and is awaiting arrival of her husband from [place] tomorrow and will likely transition to full comfort care with terminal extubation. I am in agreement.This patient is critically ill and at significant risk of neurological worsening, death and care requires constant  monitoring of vital signs, hemodynamics,respiratory and cardiac monitoring, extensive review of multiple databases, frequent neurological assessment, discussion with family, other specialists and medical decision making of high complexity.I have made any additions or clarifications directly to the above  note.This critical care time does not reflect procedure time, or teaching time or supervisory time of PA/NP/Med Resident etc but could involve care discussion time.  I spent 35 minutes of neurocritical care time  in the care of  this patient.     Delia Heady, MD Medical Director [facility] Pager: [phone] 11/08/2017 3:40 PM   To contact Stroke Continuity provider, please refer to [email or web address]. After hours, contact General Neurology

## 2017-11-09 DIAGNOSIS — Z515 Encounter for palliative care: Secondary | ICD-10-CM

## 2017-11-09 DIAGNOSIS — I610 Nontraumatic intracerebral hemorrhage in hemisphere, subcortical: Secondary | ICD-10-CM

## 2017-11-09 LAB — GLUCOSE, CAPILLARY
GLUCOSE-CAPILLARY: 129 mg/dL — AB (ref 65–99)
Glucose-Capillary: 146 mg/dL — ABNORMAL HIGH (ref 65–99)
Glucose-Capillary: 168 mg/dL — ABNORMAL HIGH (ref 65–99)
Glucose-Capillary: 169 mg/dL — ABNORMAL HIGH (ref 65–99)

## 2017-11-09 MED ORDER — BIOTENE DRY MOUTH MT LIQD: 15.0000 mL | OROMUCOSAL | Status: DC | PRN

## 2017-11-09 MED ORDER — ONDANSETRON 4 MG PO TBDP: 4.0000 mg | ORAL_TABLET | Freq: Four times a day (QID) | ORAL | Status: DC | PRN

## 2017-11-09 MED ORDER — MORPHINE BOLUS VIA INFUSION
2.0000 mg | INTRAVENOUS | Status: DC | PRN
  Administered 2017-11-09 (×3): 2 mg via INTRAVENOUS
  Filled 2017-11-09: qty 2

## 2017-11-09 MED ORDER — MORPHINE SULFATE (PF) 2 MG/ML IV SOLN
1.0000 mg | INTRAVENOUS | Status: DC | PRN
  Administered 2017-11-09: 1 mg via INTRAVENOUS
  Filled 2017-11-09: qty 1

## 2017-11-09 MED ORDER — POTASSIUM CHLORIDE 10 MEQ/100ML IV SOLN
10.0000 meq | INTRAVENOUS | Status: DC
  Administered 2017-11-09 (×3): 10 meq via INTRAVENOUS
  Filled 2017-11-09 (×4): qty 100

## 2017-11-09 MED ORDER — ACETAMINOPHEN 650 MG RE SUPP: 650.0000 mg | Freq: Four times a day (QID) | RECTAL | Status: DC | PRN

## 2017-11-09 MED ORDER — MIDAZOLAM BOLUS VIA INFUSION
0.5000 mg | INTRAVENOUS | Status: DC | PRN
  Administered 2017-11-09: 0.5 mg via INTRAVENOUS
  Filled 2017-11-09: qty 1

## 2017-11-09 MED ORDER — HALOPERIDOL 0.5 MG PO TABS
0.5000 mg | ORAL_TABLET | ORAL | Status: DC | PRN
  Filled 2017-11-09: qty 1

## 2017-11-09 MED ORDER — ONDANSETRON HCL 4 MG/2ML IJ SOLN: 4.0000 mg | Freq: Four times a day (QID) | INTRAMUSCULAR | Status: DC | PRN

## 2017-11-09 MED ORDER — ACETAMINOPHEN 325 MG PO TABS: 650.0000 mg | ORAL_TABLET | Freq: Four times a day (QID) | ORAL | Status: DC | PRN

## 2017-11-09 MED ORDER — MORPHINE SULFATE (PF) 2 MG/ML IV SOLN: 2.0000 mg | INTRAVENOUS | Status: DC | PRN

## 2017-11-09 MED ORDER — SODIUM CHLORIDE 0.9 % IV SOLN
0.5000 mg/h | INTRAVENOUS | Status: DC
  Administered 2017-11-09: 0.5 mg/h via INTRAVENOUS
  Filled 2017-11-09: qty 10

## 2017-11-09 MED ORDER — MORPHINE SULFATE (PF) 2 MG/ML IV SOLN
1.0000 mg | INTRAVENOUS | Status: AC
  Administered 2017-11-09: 1 mg via INTRAVENOUS
  Filled 2017-11-09: qty 1

## 2017-11-09 MED ORDER — GLYCOPYRROLATE 1 MG PO TABS
1.0000 mg | ORAL_TABLET | ORAL | Status: DC | PRN
  Filled 2017-11-09: qty 1

## 2017-11-09 MED ORDER — MORPHINE 100MG IN NS 100ML (1MG/ML) PREMIX INFUSION
2.0000 mg/h | INTRAVENOUS | Status: DC
  Administered 2017-11-09: 2 mg/h via INTRAVENOUS
  Filled 2017-11-09: qty 100

## 2017-11-09 MED ORDER — GLYCOPYRROLATE 0.2 MG/ML IJ SOLN: 0.2000 mg | INTRAMUSCULAR | Status: DC | PRN

## 2017-11-09 MED ORDER — HALOPERIDOL LACTATE 5 MG/ML IJ SOLN: 0.5000 mg | INTRAMUSCULAR | Status: DC | PRN

## 2017-11-09 MED ORDER — HALOPERIDOL LACTATE 2 MG/ML PO CONC
0.5000 mg | ORAL | Status: DC | PRN
  Filled 2017-11-09: qty 0.3

## 2017-11-09 MED ORDER — POLYVINYL ALCOHOL 1.4 % OP SOLN
1.0000 [drp] | Freq: Four times a day (QID) | OPHTHALMIC | Status: DC | PRN
  Filled 2017-11-09: qty 15

## 2017-11-10 NOTE — Progress Notes (Signed)
25ml of versed wasted in the sink and 55ml of morphine wasted in sink also. Darnelle Going RN witnessed Biomedical engineer.

## 2017-11-10 NOTE — Progress Notes (Signed)
Pt asystole on the monitor. Two RN verification death noted by self, Fanny Bien RN and Ernie Hew RN. Lungs and heart auscultated for . Pt pronounced on 11-16-2017 at 23:50.

## 2017-11-11 LAB — CULTURE, BLOOD (ROUTINE X 2)
Culture: NO GROWTH
Culture: NO GROWTH
Special Requests: ADEQUATE

## 2017-11-12 ENCOUNTER — Telehealth: Payer: Self-pay

## 2017-11-12 NOTE — Telephone Encounter (Signed)
On 11/12/17 I received a d/c from [facility] (original). The d/c is for cremation. The patient is a patient of [facility]. The d/c will be taken to [facility] ([address]) for signature.  On 11/12/17 I received the d/c back from Doctor Craige Cotta who signed the d/c for Doctor Mateo Flow..  I got the d/c ready and called the funeral home to let them know the d/c is ready for pickup. I also faxed a copy to the funeral home per the funeral home request.

## 2017-12-08 NOTE — Progress Notes (Signed)
PULMONARY / CRITICAL CARE MEDICINE   Name: Akari Defelice MRN: [id number] DOB: 07-19-1932    ADMISSION DATE:  10/31/2017 CONSULTATION DATE:  10/27/2017   REFERRING MD:  ED CHIEF COMPLAINT:  Altered mental status   HISTORY OF PRESENT ILLNESS:   84 yr old lady with known HTN lives in an assisted living was brought in with altered mental status. Patient was not seen for around 24hrs and when was seen she had a GCS of 3 and was intubated in the ED. CT showed massive left frontal ICH with other areas of smaller bleeds.   Patient was assessed by neurosurgery and there is no intervention to be done and has a very poor prognosis. Neurology recommended CT angio.      Subjective: No change she remains on full mechanical dilatory support.  We await withdrawal care per family.    VITAL SIGNS: BP (!) 143/64 (BP Location: Left Arm)   Pulse (!) 109   Temp 99 F (37.2 C) (Bladder)   Resp 14   Ht  (1.626 m)   Wt 42.1 kg (92 lb 13 oz)   SpO2 99%   BMI 15.93 kg/m   HEMODYNAMICS:    VENTILATOR SETTINGS: Vent Mode: PRVC FiO2 (%):  [40 %] 40 % Set Rate:  [14 bmp] 14 bmp Vt Set:  [440 mL] 440 mL PEEP:  [5 cmH20] 5 cmH20 Pressure Support:  [12 cmH20] 12 cmH20 Plateau Pressure:  [10 cmH20-12 cmH20] 12 cmH20  INTAKE / OUTPUT: I/O last 3 completed shifts: In: 1860 [I.V.:1750; Other:10; IV Piggyback:100] Out: 1035 [Urine:1035]  PHYSICAL EXAMINATION: General: Elderly female movement support. HEENT: Tracheal tube, ventilator Neuro: Conjugate gaze.  Positive gag reflex CV: Heart sounds are regular PULM: even/non-labored, lungs bilaterally managed NW:GNFA, non-tender, bsx4 active  Extremities: warm/dry, negative edema  Skin: no rashes or lesions    LABS:  BMET Recent Labs  Lab 10/12/2017 1605 11/07/17 0319 11/08/17 0425  NA 136 139 140  K 3.5 3.7 3.3*  CL 100* 105 110  CO2 22 20* 20*  BUN CREATININE 0.79 0.59 0.44  GLUCOSE 345* 235* 125*     Electrolytes Recent Labs  Lab 10/19/2017 1605 11/07/17 0319 11/08/17 0425  CALCIUM 8.8* 8.7* 8.2*  MG 1.8  --  2.0  PHOS  --   --  1.8*    CBC Recent Labs  Lab 11/03/2017 1605 11/07/17 0319 11/08/17 0425  WBC 17.7* 19.4* 16.0*  HGB 13.6 13.5 12.2  HCT 40.4 40.4 36.8  PLT 222 194 158    Coag's Recent Labs  Lab 10/20/2017 1952  INR 1.24    Sepsis Markers Recent Labs  Lab 10/24/2017 1632 10/27/2017 1952  LATICACIDVEN 4.5* 2.4*    ABG Recent Labs  Lab 10/13/2017 1634  PHART 7.371  PCO2ART 39.7  PO2ART 410.0*    Liver Enzymes Recent Labs  Lab 11/05/2017 1605  AST 58*  ALT 23  ALKPHOS 37*  BILITOT 1.1  ALBUMIN 3.7    Cardiac Enzymes Recent Labs  Lab 11/02/2017 1605  TROPONINI 0.68*    Glucose Recent Labs  Lab 11/08/17 0401 11/08/17 0721 11/08/17 1138 11/08/17 1540 11/08/17 2018 11/08/2017 0404  GLUCAP 118* 136* 148* 168* 174* 146*    Imaging No results found.   ASSESSMENT / PLAN:  Acute Intracerebral hemorrhage  Acute encephalopathy secondary to massive ICH Acute respiratory insufficiency requiring mechanical ventilation for airway protection   Plan: Continue life support until which time family decided to withdraw care No  change in vent Neurology is following and have given poor prognosis Palliative care is following Keep comfortable Sliding-scale insulin as needed  FAMILY  - Updates: 11/28/2017 Sister updated at bedside.  We await her husband for possible withdrawal of care.  - Inter-disciplinary family meet or Palliative Care meeting due by: 11/07/2017   Brett Canales Johnnie Moten ACNP [facility] Pager [phone] till 1 pm If no answer page [phone] 12/07/2017, 9:12 AM

## 2017-12-08 NOTE — Progress Notes (Signed)
STROKE TEAM PROGRESS NOTE     INTERVAL HISTORY Her RN and sister are at the bedside.  She remains unresponsive and was intubated.  We are awaiting arrival.We are awaiting arrival of of sister's husband sister's husband prior to making patient full comfort care prior to making patient full comfort care and withdrawing ventilatory support and withdrawing ventilatory support Vitals:   Nov 30, 2017 0600 11-30-2017 0700 Nov 30, 2017 0800 Nov 30, 2017 0900  BP: 136/65 138/69 (!) 143/64 134/65  Pulse: 99 (!) 101 (!) 109 (!) 107  Resp: (!) 35 12 14 (!) 7  Temp: 99 F (37.2 C) 99.1 F (37.3 C) 99 F (37.2 C) 99 F (37.2 C)  TempSrc:   Bladder   SpO2: 100% 99% 99% 99%  Weight:      Height:        CBC:  CBC Latest Ref Rng & Units 11/08/2017 11/07/2017 10/09/2017  WBC 4.0 - 10.5 K/uL 16.0(H) 19.4(H) 17.7(H)  Hemoglobin 12.0 - 15.0 g/dL 24.4 01.0 27.2  Hematocrit 36.0 - 46.0 % 36.8 40.4 40.4  Platelets 150 - 400 K/uL 158 194 222     Comprehensive Metabolic Panel:   CMP Latest Ref Rng & Units 11/08/2017 11/07/2017 11/01/2017  Glucose 65 - 99 mg/dL 536(U) 440(H) 474(Q)  BUN 6 - 20 mg/dL Creatinine 0.44 - 1.00 mg/dL 5.95 6.38 7.56  Sodium 135 - 145 mmol/L 140 139 136  Potassium 3.5 - 5.1 mmol/L 3.3(L) 3.7 3.5  Chloride 101 - 111 mmol/L 110 105 100(L)  CO2 22 - 32 mmol/L 20(L) 20(L) 22  Calcium 8.9 - 10.3 mg/dL 8.2(L) 8.7(L) 8.8(L)  Total Protein 6.5 - 8.1 g/dL - - 6.1(L)  Total Bilirubin 0.3 - 1.2 mg/dL - - 1.1  Alkaline Phos 38 - 126 U/L - - 37(L)  AST 15 - 41 U/L - - 58(H)  ALT 14 - 54 U/L - - 23   Lipid Panel: No results found for: CHOL, TRIG, HDL, CHOLHDL, VLDL, LDLCALC HgbA1c: No results found for: HGBA1C Urine Drug Screen: No results found for: LABOPIA, COCAINSCRNUR, LABBENZ, AMPHETMU, THCU, LABBARB  Alcohol Level No results found for: [facility]  IMAGING Dg Chest Port 1 View  Result Date: 11/08/2017 CLINICAL DATA:  Respiratory failure. EXAM: PORTABLE CHEST 1 VIEW COMPARISON:  11/07/2017  FINDINGS: Endotracheal tube terminates 5 cm above the carina. Enteric tube courses into the upper abdomen with tip not imaged. The cardiomediastinal silhouette is unchanged allowing for increased rightward patient rotation on today's study. Mild right basilar opacity has slightly increased. There is minimal left basilar atelectasis. No sizable pleural effusion or pneumothorax is identified. Biapical pleural thickening is noted. IMPRESSION: Increased, mild right basilar opacity, likely atelectasis. Electronically Signed   By: Sebastian Ache M.D.   On: 11/08/2017 09:14    PHYSICAL EXAM Frail cachectic malnourished-looking elderly Caucasian lady whose intubated. . Afebrile. Head is nontraumatic. Neck is supple without bruit.    Cardiac exam no murmur or gallop. Lungs are clear to auscultation. Distal pulses are well felt. Neurological Exam :  Patient is unresponsive. Eyes are closed. She has minimum response to sternal rub with partial opening of the eyes but does not follow any commands. His mild skew eye deviation with left eye deviated slightly up and outwards. Pupils 2-3 mm irregular sluggishly reactive. Corneal reflexes are present. She does have a cough and gag. There is some respiratory effort above ventilator setting. She does withdraw left upper and lower extremity semi-purposefully to painful stimuli. There is no movement in  the right upper extremity. This trace withdrawal in the right lower extremity. ASSESSMENT/PLAN Alexis Tran is a 84 y.o. female with history of deafness, bilateral cataracts found unresponsive in her facility presenting unresponsive, intubated in the ED.     Stroke:   Large left frontal intracerebral hemorrhage likely secondary to amyloid angiopathy    NS consult Venetia Maxon) no role for surgical intervention, palliative care recommended  CT head large left frontal hemorrhage 110 cc with associated edema and mass-effect with 5 mm left to right shift.  Additional small foci  of brain hemorrhage within dorsal vermis, left superior cerebellum and left superior frontal lobe.  Small subarachnoid hemorrhage left frontal convexity and additional subarachnoid hemorrhage of the right frontal and parietal lobes and cerebellum  CTA head no ELVO.  3 mm R carotid terminus aneurysm.  Multiple scalp hematomas   CTA neck no significant stenosis   MRI  / MRA  Has cochlear implant  SCDs for VTE prophylaxis  NPO  No antithrombotic prior to admission  Disposition:    (from ALF)  Respiratory failure, ventilated, secondary to intracerebral hemorrhage   Central fever central fever secondary to intracerebral hemorrhage  Hypertensive emergency  Blood pressure as high as 183/99 and 180/122 on arrival  Systolic blood pressure goal less than 140 in acute hemorrhage    Hyperlipidemia  Home meds: Lipitor 10  Diabetes type II  Other Stroke Risk Factors  Advanced age  Other Active Problems  Deaf w/ cochlea implant  Legally blind  Hospital day # 3  I have personally examined this patient, reviewed notes, independently viewed imaging studies, participated in medical decision making and plan of care.ROS completed by me personally and pertinent positives fully documented  I have made any additions or clarifications directly to the above note  She presented with fall and unresponsiveness due to large left frontal parenchymal intracerebral hemorrhage etiology indeterminate but possibly amyloid angiopathy given advanced age. Her neurological exam and prognosis is quite poor. I had a long discussion at the bedside with the patient's sister about her poor prognosis and answered questions. She is quite clear that patient would not want prolonged ventilatory support, tracheostomy, PEG tube and going to nursing home. She agrees to DO NOT RESUSCITATE and is awaiting arrival of her husband from [place] tomorrow and will likely transition to full comfort care with terminal extubation. I  am in agreement.This patient is critically ill and at significant risk of neurological worsening, death and care requires constant monitoring of vital signs, hemodynamics,respiratory and cardiac monitoring, extensive review of multiple databases, frequent neurological assessment, discussion with family, other specialists and medical decision making of high complexity.I have made any additions or clarifications directly to the above note.This critical care time does not reflect procedure time, or teaching time or supervisory time of PA/NP/Med Resident etc but could involve care discussion time.  I spent 35 minutes of neurocritical care time  in the care of  this patient. Await withdrawal of care later today wafter family arrives. ith withdrawal of care later today after family arrives.  Stroke team will sign off.  Kindly call for questions. stroke team's sign off. Kindly call for questions.     Delia Heady, MD Medical Director [facility] Pager: [phone] Dec 04, 2017 10:08 AM   To contact Stroke Continuity provider, please refer to [email or web address]. After hours, contact General Neurology

## 2017-12-08 NOTE — Procedures (Signed)
Extubation Procedure Note  Patient Details:   Name: Alexis Tran DOB: 1932-08-03 MRN: [id number]   Airway Documentation:  Airway 7.5 mm (Active)  Secured at (cm) 22 cm 11/25/2017  8:15 AM  Measured From Lips 12/03/2017  8:15 AM  Secured Location Center 11/15/2017  8:15 AM  Secured By [employer] 11/29/2017  8:15 AM  Tube Holder Repositioned Yes 11/22/2017  8:15 AM  Cuff Pressure (cm H2O) 28 cm H2O 11/08/2017  7:43 PM  Site Condition Dry 11/07/2017  8:00 AM   Vent end date: 11/16/2017 Vent end time: 1604   Evaluation  O2 sats: stable throughout Complications: No apparent complications Patient did tolerate procedure well. Bilateral Breath Sounds: Clear, Diminished   No Pt  Terminally extubated to RA  with RN and comfort care at bedside. NARD VSS Will cont to monitor.   Betsy Pries 11/25/2017, 4:08 PM

## 2017-12-08 NOTE — Progress Notes (Signed)
   11/27/2017 1600  Clinical Encounter Type  Visited With Patient and family together;Health care provider  Visit Type Follow-up;Patient actively dying  Spiritual Encounters  Spiritual Needs Emotional;Grief support;Prayer   Followed up on a previous visit.  Patient has been extubated and resting with her sister at the bedside.  Offered empathetic listening and support.  We prayed together.  Will follow and support as needed. Chaplain Agustin Cree

## 2017-12-08 NOTE — Progress Notes (Signed)
SLP Cancellation Note  Patient Details Name: Alexis Tran MRN: [id number] DOB: December 12, 1932   Cancelled treatment:       Reason Eval/Treat Not Completed: Other (comment). Will sign off given plans for comfort measures.    Merrillyn Ackerley, Riley Nearing 11/07/2017, 7:44 AM

## 2017-12-08 NOTE — Progress Notes (Signed)
   11/28/2017 1017  Clinical Encounter Type  Visited With Patient and family together;Health care provider  Visit Type Initial;Patient actively dying;Spiritual support  Referral From Nurse  Consult/Referral To Chaplain  Spiritual Encounters  Spiritual Needs Prayer  Stress Factors  Family Stress Factors Health changes   Responded to a SCC for EOL.  Patient's sister, who states she is Licensed conveyancer, was at bedside (she and her husband live near [place], [place]).  Indicated patient had been found in her apartment at [place].  Sister seems to understand, based on what the Physicians have told her, that her sisters condition is not good.  She knows her sister would not want to be like this, but is struggling with what to do.  The sister's husband is on his way.  Patient has spiritual support from [facility] in [place].  Will follow and support as needed. Chaplain Agustin Cree

## 2017-12-08 NOTE — Death Summary Note (Signed)
Srinidhi Landers was a 84 y.o. female admitted on 10/21/2017 with altered mental status.  She required intubation for airway protection.  She had SBP 180/122 on arrival in ER.  She was found to have massive Lt frontal intracerebral hemorrhage.  She was assessed by neurosurgery, but not a surgical candidate.  She was seen by neurology.  There was no improvement in her mental status.  Family updated.  Opted for DNR status and transition for comfort measures.  She was extubated on 12/04/17 and expired at 23:50.  Cause of death: Hypertensive emergency leading to large left frontal intracerebral hemorrhage measuring 8.2 cm with 5 mm Lt to Rt midline shift likely from amyloid angiopathy.  Final diagnoses: Acute hypoxic respiratory failure with compromised airway Central fever from ICH Hypertension Diabetes mellitus Hemochromatosis Hyperlipidemia Hypokalemia Hypophosphatemia  Coralyn Helling, MD [facility] Pulmonary/Critical Care 11/27/2017, 1:26 PM

## 2017-12-08 NOTE — Progress Notes (Signed)
Pt terminally extubated at 1604 with RT, RN, and family at the bedside. Pt receiving IV morphine and Versed continuously. Pt appears to be comfortable at this time. Chaplain called to assist and comfort family.

## 2017-12-08 DEATH — deceased
